# Patient Record
Sex: Female | Born: 1950 | Race: Black or African American | Hispanic: No | Smoking: Never smoker
Health system: Southern US, Community
[De-identification: ages and names within clinical notes are randomized; demographics above are authoritative.]

---

## 2007-03-09 ENCOUNTER — Emergency Department: Payer: Self-pay | Admitting: Emergency Medicine

## 2008-07-01 ENCOUNTER — Ambulatory Visit: Payer: Self-pay | Admitting: Internal Medicine

## 2008-09-02 ENCOUNTER — Ambulatory Visit: Payer: Self-pay | Admitting: Family Medicine

## 2009-12-04 ENCOUNTER — Emergency Department: Payer: Self-pay | Admitting: Emergency Medicine

## 2009-12-16 ENCOUNTER — Emergency Department: Payer: Self-pay | Admitting: Emergency Medicine

## 2017-08-30 ENCOUNTER — Other Ambulatory Visit: Payer: Self-pay

## 2017-08-30 ENCOUNTER — Inpatient Hospital Stay: Payer: Worker's Compensation

## 2017-08-30 ENCOUNTER — Inpatient Hospital Stay
Admission: AD | Admit: 2017-08-30 | Discharge: 2017-09-04 | DRG: 470 | Disposition: A | Discharge status: Home Health | Payer: Worker's Compensation | Source: Ambulatory Visit | Attending: Orthopedic Surgery | Admitting: Orthopedic Surgery

## 2017-08-30 DIAGNOSIS — M1611 Unilateral primary osteoarthritis, right hip: Secondary | ICD-10-CM | POA: Diagnosis present

## 2017-08-30 DIAGNOSIS — S72001A Fracture of unspecified part of neck of right femur, initial encounter for closed fracture: Secondary | ICD-10-CM | POA: Diagnosis present

## 2017-08-30 DIAGNOSIS — G8918 Other acute postprocedural pain: Secondary | ICD-10-CM

## 2017-08-30 DIAGNOSIS — Y9301 Activity, walking, marching and hiking: Secondary | ICD-10-CM | POA: Diagnosis present

## 2017-08-30 DIAGNOSIS — D62 Acute posthemorrhagic anemia: Secondary | ICD-10-CM | POA: Diagnosis not present

## 2017-08-30 DIAGNOSIS — W000XXA Fall on same level due to ice and snow, initial encounter: Secondary | ICD-10-CM | POA: Diagnosis present

## 2017-08-30 DIAGNOSIS — Z720 Tobacco use: Secondary | ICD-10-CM

## 2017-08-30 DIAGNOSIS — Z419 Encounter for procedure for purposes other than remedying health state, unspecified: Secondary | ICD-10-CM

## 2017-08-30 DIAGNOSIS — R5082 Postprocedural fever: Secondary | ICD-10-CM

## 2017-08-30 DIAGNOSIS — I10 Essential (primary) hypertension: Secondary | ICD-10-CM

## 2017-08-30 LAB — CBC
HCT: 41.5 % (ref 35.0–47.0)
Hemoglobin: 13.3 g/dL (ref 12.0–16.0)
MCH: 26.2 pg (ref 26.0–34.0)
MCHC: 32.1 g/dL (ref 32.0–36.0)
MCV: 81.6 fL (ref 80.0–100.0)
PLATELETS: 218 10*3/uL (ref 150–440)
RBC: 5.09 MIL/uL (ref 3.80–5.20)
RDW: 15.1 % — ABNORMAL HIGH (ref 11.5–14.5)
WBC: 5.2 10*3/uL (ref 3.6–11.0)

## 2017-08-30 LAB — SURGICAL PCR SCREEN
MRSA, PCR: NEGATIVE
STAPHYLOCOCCUS AUREUS: NEGATIVE

## 2017-08-30 LAB — URINALYSIS, ROUTINE W REFLEX MICROSCOPIC
BILIRUBIN URINE: NEGATIVE
GLUCOSE, UA: NEGATIVE mg/dL
HGB URINE DIPSTICK: NEGATIVE
KETONES UR: NEGATIVE mg/dL
Leukocytes, UA: NEGATIVE
Nitrite: NEGATIVE
PH: 5 (ref 5.0–8.0)
Protein, ur: NEGATIVE mg/dL
Specific Gravity, Urine: 1.018 (ref 1.005–1.030)

## 2017-08-30 LAB — APTT: aPTT: 29 seconds (ref 24–36)

## 2017-08-30 LAB — PROTIME-INR
INR: 0.94
PROTHROMBIN TIME: 12.5 s (ref 11.4–15.2)

## 2017-08-30 MED ORDER — SODIUM CHLORIDE 0.9 % IV SOLN
INTRAVENOUS | Status: DC
  Administered 2017-08-30 – 2017-09-01 (×3): via INTRAVENOUS

## 2017-08-30 MED ORDER — MORPHINE SULFATE (PF) 2 MG/ML IV SOLN: 2.0000 mg | INTRAVENOUS | Status: DC | PRN

## 2017-08-30 MED ORDER — OXYCODONE HCL 5 MG PO TABS: 5.0000 mg | ORAL_TABLET | ORAL | Status: DC | PRN

## 2017-08-30 NOTE — H&P (Signed)
Subjective:   Patient is a 66 y.o. female presents with right hip pain. 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 She took ibuprofen during the evening and was having persisting pain. She can't to the walk-in clinic external clinic where she is found to have a femoral neck fracture. She also has severe osteoarthritis of the hip which has not been symptomatic and she is being admitted for treatment of these conditions with right total hip replacement   ago with gradually worsening course since that time. The pain is located in the right groin. Patient describes the pain as an ache especially with any weightbearing continuous and rated as moderate. Pain has been associated with weightbearing and her fall. Patient denies prodromal symptoms. Symptoms are aggravated by weightbearing. Symptoms improve with nonweightbearing and sitting. Past history includes no prior treatment for the hip.  Previous studies include radiographs acute no clinic showing femoral neck fracture.  Patient Active Problem List   Diagnosis Date Noted  . Closed displaced fracture of right femoral neck (HCC) 08/30/2017   History reviewed. No pertinent past medical history.  History reviewed. No pertinent surgical history.  No medications prior to admission.   Allergies  Allergen Reactions  . Hydrocodone   . Sulfa Antibiotics     Social History   Tobacco Use  . Smoking status: Never Smoker  . Smokeless tobacco: Never Used  Substance Use Topics  . Alcohol use: No    Frequency: Never    History reviewed. No pertinent family history.  Review of Systems Pertinent items are noted in HPI.  Objective:   Patient Vitals for the past 8 hrs:  BP Temp Temp src Pulse Resp SpO2 Height Weight  08/30/17 1456 (!) 177/77 98.5 F (36.9 C) Oral (!) 55 18 100 % - -  08/30/17 1447 - - - - - - 5\' 5"  (1.651 m) 75.8 kg (167 lb)    No intake/output data recorded. No intake/output data recorded.    BP (!) 169/88 (BP Location: Left Arm)   Pulse 63   Temp 98.6 F (37 C) (Oral)   Resp 18   Ht 5\' 5"  (1.651 m)   Wt 75.8 kg (167 lb)   SpO2 100%   BMI 27.79 kg/m  General appearance: alert, appears stated age and mild distress Lungs: clear to auscultation bilaterally Heart: regular rate and rhythm, S1, S2 normal, no murmur, click, rub or gallop Extremities: She has 0 internal rotation on the right with pain and 10 external rotation with pain. She has palpable pulses and sensation is intact to the feet.  ECG: Pending.  Data Review labs are pending  Assessment:   Active Problems:   Closed displaced fracture of right femoral neck (HCC)   Plan:   Admitted for right hip replacement for femoral neck fracture and severe osteoarthritis

## 2017-08-30 NOTE — Progress Notes (Signed)
Pt arrived to room 148 via direct admit. Skin assessment completed with Asher MuirJamie, RN. Pt on room air. No IV at this time. No orders at this time.Pt very upset about her situation and with her job because they did not scrape the parking lot causing her to fall. Pt oriented to call bell system and unit. Call bell and phone within reach.

## 2017-08-31 ENCOUNTER — Inpatient Hospital Stay: Payer: Worker's Compensation | Admitting: Anesthesiology

## 2017-08-31 ENCOUNTER — Inpatient Hospital Stay: Payer: Worker's Compensation

## 2017-08-31 ENCOUNTER — Encounter
Admission: AD | Disposition: A | Discharge status: Home Health | Payer: Self-pay | Source: Ambulatory Visit | Attending: Orthopedic Surgery

## 2017-08-31 ENCOUNTER — Encounter: Payer: Self-pay | Admitting: Anesthesiology

## 2017-08-31 HISTORY — PX: TOTAL HIP ARTHROPLASTY: SHX124

## 2017-08-31 LAB — CBC
HEMATOCRIT: 40.8 % (ref 35.0–47.0)
Hemoglobin: 13.1 g/dL (ref 12.0–16.0)
MCH: 26.2 pg (ref 26.0–34.0)
MCHC: 32 g/dL (ref 32.0–36.0)
MCV: 82 fL (ref 80.0–100.0)
PLATELETS: 244 10*3/uL (ref 150–440)
RBC: 4.98 MIL/uL (ref 3.80–5.20)
RDW: 15 % — AB (ref 11.5–14.5)
WBC: 4.7 10*3/uL (ref 3.6–11.0)

## 2017-08-31 LAB — BASIC METABOLIC PANEL
ANION GAP: 8 (ref 5–15)
BUN: 15 mg/dL (ref 6–20)
CALCIUM: 9.2 mg/dL (ref 8.9–10.3)
CO2: 23 mmol/L (ref 22–32)
Chloride: 108 mmol/L (ref 101–111)
Creatinine, Ser: 0.85 mg/dL (ref 0.44–1.00)
GFR calc Af Amer: >60 mL/min (ref >60)
GLUCOSE: 96 mg/dL (ref 65–99)
POTASSIUM: 4 mmol/L (ref 3.5–5.1)
SODIUM: 139 mmol/L (ref 135–145)

## 2017-08-31 LAB — TYPE AND SCREEN
ABO/RH(D): O POS
Antibody Screen: NEGATIVE

## 2017-08-31 LAB — SEDIMENTATION RATE: SED RATE: 12 mm/h (ref 0–30)

## 2017-08-31 SURGERY — ARTHROPLASTY, HIP, TOTAL, ANTERIOR APPROACH
Anesthesia: Spinal | Site: Hip | Laterality: Right | Wound class: Clean

## 2017-08-31 MED ORDER — ACETAMINOPHEN 500 MG PO TABS
1000.0000 mg | ORAL_TABLET | Freq: Four times a day (QID) | ORAL | Status: AC
  Administered 2017-08-31 – 2017-09-01 (×4): 1000 mg via ORAL
  Filled 2017-08-31 (×4): qty 2

## 2017-08-31 MED ORDER — PROPOFOL 10 MG/ML IV BOLUS
INTRAVENOUS | Status: AC
  Filled 2017-08-31: qty 20

## 2017-08-31 MED ORDER — PHENOL 1.4 % MT LIQD
1.0000 | OROMUCOSAL | Status: DC | PRN
  Filled 2017-08-31: qty 177

## 2017-08-31 MED ORDER — OXYCODONE HCL 5 MG/5ML PO SOLN: 5.0000 mg | Freq: Once | ORAL | Status: DC | PRN

## 2017-08-31 MED ORDER — PROPOFOL 10 MG/ML IV BOLUS
INTRAVENOUS | Status: DC | PRN
  Administered 2017-08-31: 40 mg via INTRAVENOUS
  Administered 2017-08-31: 10 mg via INTRAVENOUS

## 2017-08-31 MED ORDER — MIDAZOLAM HCL 5 MG/5ML IJ SOLN
INTRAMUSCULAR | Status: DC | PRN
  Administered 2017-08-31: 2 mg via INTRAVENOUS

## 2017-08-31 MED ORDER — MIDAZOLAM HCL 2 MG/2ML IJ SOLN
INTRAMUSCULAR | Status: AC
  Filled 2017-08-31: qty 2

## 2017-08-31 MED ORDER — NEOMYCIN-POLYMYXIN B GU 40-200000 IR SOLN
Status: DC | PRN
  Administered 2017-08-31: 4 mL

## 2017-08-31 MED ORDER — MENTHOL 3 MG MT LOZG
1.0000 | LOZENGE | OROMUCOSAL | Status: DC | PRN
Start: 2017-08-31 — End: 2017-09-04
  Filled 2017-08-31: qty 9

## 2017-08-31 MED ORDER — LACTATED RINGERS IV SOLN
INTRAVENOUS | Status: DC | PRN
  Administered 2017-08-31: 12:00:00 via INTRAVENOUS

## 2017-08-31 MED ORDER — TRANEXAMIC ACID 1000 MG/10ML IV SOLN
INTRAVENOUS | Status: DC | PRN
  Administered 2017-08-31: 1000 mg via INTRAVENOUS

## 2017-08-31 MED ORDER — OXYCODONE HCL 5 MG PO TABS: 5.0000 mg | ORAL_TABLET | Freq: Once | ORAL | Status: DC | PRN

## 2017-08-31 MED ORDER — BUPIVACAINE-EPINEPHRINE 0.25% -1:200000 IJ SOLN
INTRAMUSCULAR | Status: DC | PRN
  Administered 2017-08-31: 30 mL

## 2017-08-31 MED ORDER — MAGNESIUM CITRATE PO SOLN
1.0000 | Freq: Once | ORAL | Status: DC | PRN
  Filled 2017-08-31: qty 296

## 2017-08-31 MED ORDER — PHENYLEPHRINE HCL 10 MG/ML IJ SOLN
INTRAMUSCULAR | Status: DC | PRN
  Administered 2017-08-31 (×2): 100 ug via INTRAVENOUS

## 2017-08-31 MED ORDER — ALUM & MAG HYDROXIDE-SIMETH 200-200-20 MG/5ML PO SUSP
30.0000 mL | ORAL | Status: DC | PRN
  Administered 2017-09-03: 30 mL via ORAL
  Filled 2017-08-31: qty 30

## 2017-08-31 MED ORDER — DEXTROSE 5 % IV SOLN
2.0000 g | Freq: Four times a day (QID) | INTRAVENOUS | Status: AC
  Administered 2017-08-31 (×2): 2 g via INTRAVENOUS
  Filled 2017-08-31 (×2): qty 2000

## 2017-08-31 MED ORDER — FENTANYL CITRATE (PF) 100 MCG/2ML IJ SOLN: 25.0000 ug | INTRAMUSCULAR | Status: DC | PRN

## 2017-08-31 MED ORDER — SUGAMMADEX SODIUM 200 MG/2ML IV SOLN
INTRAVENOUS | Status: AC
  Filled 2017-08-31: qty 2

## 2017-08-31 MED ORDER — ENOXAPARIN SODIUM 40 MG/0.4ML ~~LOC~~ SOLN
40.0000 mg | SUBCUTANEOUS | Status: DC
  Administered 2017-09-01 – 2017-09-04 (×4): 40 mg via SUBCUTANEOUS
  Filled 2017-08-31 (×4): qty 0.4

## 2017-08-31 MED ORDER — PROPOFOL 500 MG/50ML IV EMUL
INTRAVENOUS | Status: DC | PRN
  Administered 2017-08-31: 75 ug/kg/min via INTRAVENOUS

## 2017-08-31 MED ORDER — SODIUM CHLORIDE 0.9 % IV SOLN
INTRAVENOUS | Status: DC | PRN
  Administered 2017-08-31: 40 ug/min via INTRAVENOUS

## 2017-08-31 MED ORDER — ONDANSETRON HCL 4 MG/2ML IJ SOLN
4.0000 mg | Freq: Four times a day (QID) | INTRAMUSCULAR | Status: DC | PRN
  Administered 2017-08-31 – 2017-09-04 (×3): 4 mg via INTRAVENOUS
  Filled 2017-08-31 (×4): qty 2

## 2017-08-31 MED ORDER — PROPOFOL 500 MG/50ML IV EMUL
INTRAVENOUS | Status: AC
  Filled 2017-08-31: qty 50

## 2017-08-31 MED ORDER — ONDANSETRON HCL 4 MG PO TABS: 4.0000 mg | ORAL_TABLET | Freq: Four times a day (QID) | ORAL | Status: DC | PRN

## 2017-08-31 MED ORDER — CEFAZOLIN SODIUM-DEXTROSE 2-4 GM/100ML-% IV SOLN
2.0000 g | Freq: Once | INTRAVENOUS | Status: AC
  Administered 2017-08-31: 2 g via INTRAVENOUS

## 2017-08-31 MED ORDER — MAGNESIUM HYDROXIDE 400 MG/5ML PO SUSP
30.0000 mL | Freq: Every day | ORAL | Status: DC | PRN
  Administered 2017-09-01: 30 mL via ORAL
  Filled 2017-08-31: qty 30

## 2017-08-31 MED ORDER — CEFAZOLIN SODIUM-DEXTROSE 2-4 GM/100ML-% IV SOLN
INTRAVENOUS | Status: AC
  Filled 2017-08-31: qty 100

## 2017-08-31 MED ORDER — DOCUSATE SODIUM 100 MG PO CAPS
100.0000 mg | ORAL_CAPSULE | Freq: Two times a day (BID) | ORAL | Status: DC
  Administered 2017-08-31 – 2017-09-04 (×6): 100 mg via ORAL
  Filled 2017-08-31 (×7): qty 1

## 2017-08-31 MED ORDER — BISACODYL 10 MG RE SUPP: 10.0000 mg | Freq: Every day | RECTAL | Status: DC | PRN

## 2017-08-31 MED ORDER — ACETAMINOPHEN 650 MG RE SUPP: 650.0000 mg | RECTAL | Status: DC | PRN

## 2017-08-31 MED ORDER — SODIUM CHLORIDE 0.9 % IV SOLN
INTRAVENOUS | Status: DC
  Administered 2017-08-31: 15:00:00 via INTRAVENOUS

## 2017-08-31 MED ORDER — BUPIVACAINE HCL (PF) 0.5 % IJ SOLN
INTRAMUSCULAR | Status: DC | PRN
  Administered 2017-08-31: 3 mL

## 2017-08-31 MED ORDER — CEFAZOLIN SODIUM-DEXTROSE 2-4 GM/100ML-% IV SOLN: 2.0000 g | Freq: Four times a day (QID) | INTRAVENOUS | Status: DC

## 2017-08-31 MED ORDER — ACETAMINOPHEN 325 MG PO TABS
650.0000 mg | ORAL_TABLET | ORAL | Status: DC | PRN
  Administered 2017-09-01 – 2017-09-04 (×5): 650 mg via ORAL
  Filled 2017-08-31 (×5): qty 2

## 2017-08-31 MED ORDER — KETAMINE HCL 50 MG/ML IJ SOLN
INTRAMUSCULAR | Status: AC
  Filled 2017-08-31: qty 10

## 2017-08-31 MED ORDER — TRAMADOL HCL 50 MG PO TABS
100.0000 mg | ORAL_TABLET | Freq: Four times a day (QID) | ORAL | Status: DC | PRN
  Administered 2017-08-31: 100 mg via ORAL
  Administered 2017-09-01: 50 mg via ORAL
  Administered 2017-09-01: 100 mg via ORAL
  Filled 2017-08-31 (×3): qty 2

## 2017-08-31 MED ORDER — DIPHENHYDRAMINE HCL 12.5 MG/5ML PO ELIX: 12.5000 mg | ORAL_SOLUTION | ORAL | Status: DC | PRN

## 2017-08-31 SURGICAL SUPPLY — 51 items
BNDG COHESIVE 6X5 TAN STRL LF (GAUZE/BANDAGES/DRESSINGS) ×9 IMPLANT
CANISTER SUCT 1200ML W/VALVE (MISCELLANEOUS) ×3 IMPLANT
CAPT HIP TOTAL 3 ×3 IMPLANT
CHLORAPREP W/TINT 26ML (MISCELLANEOUS) ×3 IMPLANT
DRAPE C-ARM XRAY 36X54 (DRAPES) ×3 IMPLANT
DRAPE INCISE IOBAN 66X60 STRL (DRAPES) IMPLANT
DRAPE POUCH INSTRU U-SHP 10X18 (DRAPES) ×3 IMPLANT
DRAPE SHEET LG 3/4 BI-LAMINATE (DRAPES) ×9 IMPLANT
DRAPE TABLE BACK 80X90 (DRAPES) ×3 IMPLANT
DRESSING SURGICEL FIBRLLR 1X2 (HEMOSTASIS) ×2 IMPLANT
DRSG OPSITE POSTOP 4X8 (GAUZE/BANDAGES/DRESSINGS) ×6 IMPLANT
DRSG SURGICEL FIBRILLAR 1X2 (HEMOSTASIS) ×6
ELECT BLADE 6.5 EXT (BLADE) ×3 IMPLANT
ELECT REM PT RETURN 9FT ADLT (ELECTROSURGICAL) ×3
ELECTRODE REM PT RTRN 9FT ADLT (ELECTROSURGICAL) ×1 IMPLANT
EVACUATOR 1/8 PVC DRAIN (DRAIN) IMPLANT
GLOVE BIOGEL PI IND STRL 9 (GLOVE) ×1 IMPLANT
GLOVE BIOGEL PI INDICATOR 9 (GLOVE) ×2
GLOVE SURG SYN 9.0  PF PI (GLOVE) ×4
GLOVE SURG SYN 9.0 PF PI (GLOVE) ×2 IMPLANT
GOWN SRG 2XL LVL 4 RGLN SLV (GOWNS) ×1 IMPLANT
GOWN STRL NON-REIN 2XL LVL4 (GOWNS) ×2
GOWN STRL REUS W/ TWL LRG LVL3 (GOWN DISPOSABLE) ×1 IMPLANT
GOWN STRL REUS W/TWL LRG LVL3 (GOWN DISPOSABLE) ×2
HOLDER FOLEY CATH W/STRAP (MISCELLANEOUS) ×3 IMPLANT
HOOD PEEL AWAY FLYTE STAYCOOL (MISCELLANEOUS) ×3 IMPLANT
KIT PREVENA INCISION MGT 13 (CANNISTER) ×3 IMPLANT
MAT BLUE FLOOR 46X72 FLO (MISCELLANEOUS) ×3 IMPLANT
NDL SAFETY ECLIPSE 18X1.5 (NEEDLE) ×1 IMPLANT
NEEDLE HYPO 18GX1.5 SHARP (NEEDLE) ×2
NEEDLE SPNL 18GX3.5 QUINCKE PK (NEEDLE) ×3 IMPLANT
NS IRRIG 1000ML POUR BTL (IV SOLUTION) ×3 IMPLANT
PACK HIP COMPR (MISCELLANEOUS) ×3 IMPLANT
SOL PREP PVP 2OZ (MISCELLANEOUS) ×3
SOLUTION PREP PVP 2OZ (MISCELLANEOUS) ×1 IMPLANT
SPONGE DRAIN TRACH 4X4 STRL 2S (GAUZE/BANDAGES/DRESSINGS) ×3 IMPLANT
STAPLER SKIN PROX 35W (STAPLE) ×3 IMPLANT
STRAP SAFETY BODY (MISCELLANEOUS) ×3 IMPLANT
SUT DVC 2 QUILL PDO  T11 36X36 (SUTURE) ×2
SUT DVC 2 QUILL PDO T11 36X36 (SUTURE) ×1 IMPLANT
SUT SILK 0 (SUTURE) ×2
SUT SILK 0 30XBRD TIE 6 (SUTURE) ×1 IMPLANT
SUT V-LOC 90 ABS DVC 3-0 CL (SUTURE) ×3 IMPLANT
SUT VIC AB 1 CT1 36 (SUTURE) ×3 IMPLANT
SYR 20CC LL (SYRINGE) ×3 IMPLANT
SYR 30ML LL (SYRINGE) ×3 IMPLANT
SYR BULB IRRIG 60ML STRL (SYRINGE) ×3 IMPLANT
TAPE MICROFOAM 4IN (TAPE) ×3 IMPLANT
TOWEL OR 17X26 4PK STRL BLUE (TOWEL DISPOSABLE) ×3 IMPLANT
TRAY FOLEY W/METER SILVER 16FR (SET/KITS/TRAYS/PACK) ×3 IMPLANT
WND VAC CANISTER 500ML (MISCELLANEOUS) ×3 IMPLANT

## 2017-08-31 NOTE — Anesthesia Procedure Notes (Signed)
Date/Time: 08/31/2017 12:00 PM Performed by: Junious SilkNoles, Brycelyn Gambino, CRNA Pre-anesthesia Checklist: Patient identified, Emergency Drugs available, Suction available, Patient being monitored and Timeout performed Oxygen Delivery Method: Simple face mask

## 2017-08-31 NOTE — Clinical Social Work Note (Signed)
Clinical Social Work Assessment  Patient Details  Name: VICTORIAH WILDS MRN: [id number] Date of Birth: [birth year]  Date of referral:  08/31/17               Reason for consult:  Facility Placement                Permission sought to share information with:    Permission granted to share information::     Name::        Agency::     Relationship::     Contact Information:     Housing/Transportation Living arrangements for the past 2 months:  Single Family Home Source of Information:  Patient Patient Interpreter Needed:  None Criminal Activity/Legal Involvement Pertinent to Current Situation/Hospitalization:  No - Comment as needed Significant Relationships:  Adult Children Lives with:  Adult Children Do you feel safe going back to the place where you live?  Yes Need for family participation in patient care:  Yes (Comment)  Care giving concerns:  Patient lives in [place] and her son Laverna Peace stays with her.     Social Worker assessment / plan:  Holiday representative (CSW) reviewed chart and noted that patient is going for surgery for a hip fracture today. CSW met with patient alone at bedside prior to surgery today. Patient was alert and oriented X4 and was sitting up in the bed. CSW introduced self and explained role of CSW department. 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 Per patient she fell in the parking lot going into work at [employer]. Patient was very adamant that worker's comp is going to pay for hospital stay and after care. Per patient worker's comp has already been filed, claim # [id number] phone # [phone]. CSW explained that PT will work with patient after surgery and make a recommendation of home health or SNF. Patient reported that she is going home and her son and daughter will provide support. 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 CSW explained that worker's comp is sometimes difficult to get to pay  for home health or SNF. Patient reported that she is definitely going home and remains adamant that worker's comp will pay for everything. RN case Freight forwarder and RN aware of above. CSW will continue to follow and assist as needed.   Employment status:  [employer] information:  Other (Comment Required)(Worker's comp. ) PT Recommendations:  Not assessed at this time Information / Referral to community resources:  Other (Comment Required)(Patient prefers to D/C home with home health. )  Patient/Family's Response to care:  Patient prefers to go home.   Patient/Family's Understanding of and Emotional Response to Diagnosis, Current Treatment, and Prognosis:  Patient was very pleasant and thanked CSW for assistance.   Emotional Assessment Appearance:  Appears stated age Attitude/Demeanor/Rapport:    Affect (typically observed):  Pleasant Orientation:  Oriented to Self, Oriented to Place, Oriented to  Time, Oriented to Situation Alcohol / Substance use:  Not Applicable Psych involvement (Current and /or in the community):  No (Comment)  Discharge Needs  Concerns to be addressed:  Discharge Planning Concerns Readmission within the last 30 days:  No Current discharge risk:  Lives alone Barriers to Discharge:  Continued Medical Work up   [employer], Veronia Beets, LCSW 08/31/2017, 3:14 PM

## 2017-08-31 NOTE — Op Note (Signed)
08/30/2017 - 08/31/2017  1:10 PM  PATIENT:  Rose Graham  66 y.o. female  PRE-OPERATIVE DIAGNOSIS:  CLOSED FRACTURE OF RIGHT FEMUR,ACUTE RIGHT HIP PAIN with right hip degenerative osteoarthritis  POST-OPERATIVE DIAGNOSIS:  CLOSED FRACTURE OF RIGHT FEMUR,ACUTE RIGHT HIP PAIN with right hip degenerative osteoarthritis  PROCEDURE:  Procedure(s): TOTAL HIP ARTHROPLASTY ANTERIOR APPROACH (Right)  SURGEON: Leitha SchullerMichael J Shirlean Berman, MD  ASSISTANTS: None  ANESTHESIA:   spinal  EBL:  Total I/O In: -  Out: 525 [Urine:225; Blood:300]  BLOOD ADMINISTERED:none  DRAINS: none   LOCAL MEDICATIONS USED:  MARCAINE     SPECIMEN:  Source of Specimen:  Right femoral head  DISPOSITION OF SPECIMEN:  PATHOLOGY  COUNTS:  YES  TOURNIQUET:  * No tourniquets in log *  IMPLANTS: MedactaAMIS 2 standard stem with 54 mm  Mpact DM cup with liner and S 28 metal head   DICTATION: .Dragon Dictation   The patient was brought to the operating room and after spinal anesthesia was obpatient was placed on the operative table with the ipsilateral foot into the Medacta attachment, contralateral leg on a well-padded table. C-arm was brought in and preop template x-ray taken. After prepping and draping in usual sterile fashion appropriate patient identification and timeout procedures were completed. Anterior approach to the hip was obtained and centered over the greater trochanter and TFL muscle. The subcutaneous tissue was incised hemostasis being achieved by electrocautery. TFL fascia was incised and the muscle retracted laterally deep retractor placed. The lateral femoral circumflex vessels were identified and ligated. The anterior capsule was exposed and a capsulotomy performed. The neck was identified and a femoral neck cut carried out with a saw. The head was removed without difficulty and showed sclerotic femoral head and acetabulum. Reaming was carried out to52 mm and a 54 mm cup trial gave appropriate tightness to the  acetabular component a 50 4D cup was impacted into position. The leg was then externally rotated and ischiofemoral and pubofemoral releases carried out. The femur was sequentially broached to a size 2, si2 stem with standard neck and S head trials were placed and the final components chosen. The 2 standard stem was inserted along with a metal S 28 mm head an54 mm liner. The hip was reduced and was stable the wound was thoroughly irrigated wifibrillar placed on the posterior capsular incision and medial neck cutThe deep fascia v was closed using heavy Quill after infiltration of 30 cc of quarter percent Sensorcaine with epinephrine. 3-0 v-loc  to close the skin with skin staples  and wound VAC applied   PLAN OF CARE: Continue as an inpatient

## 2017-08-31 NOTE — Anesthesia Post-op Follow-up Note (Signed)
Anesthesia QCDR form completed.        

## 2017-08-31 NOTE — Anesthesia Procedure Notes (Signed)
Spinal  Patient location during procedure: OR Start time: 08/31/2017 11:38 AM End time: 08/31/2017 11:48 AM Staffing Resident/CRNA: Nelda Marseille, CRNA Performed: resident/CRNA  Preanesthetic Checklist Completed: patient identified, site marked, surgical consent, pre-op evaluation, timeout performed, IV checked, risks and benefits discussed and monitors and equipment checked Spinal Block Patient position: sitting Prep: Betadine Patient monitoring: heart rate, continuous pulse ox, blood pressure and cardiac monitor Approach: midline Location: L3-4 Injection technique: single-shot Needle Needle type: Whitacre and Introducer  Needle gauge: 25 G Needle length: 9 cm Assessment Sensory level: T10 Additional Notes Negative paresthesia. Negative blood return. Positive free-flowing CSF. Expiration date of kit checked and confirmed. Patient tolerated procedure well, without complications.

## 2017-08-31 NOTE — Plan of Care (Signed)
  Progressing Education: Knowledge of General Education information will improve 08/31/2017 1831 - Progressing by Cordie Griceodriguez, Darleen Moffitt A, RN Health Behavior/Discharge Planning: Ability to manage health-related needs will improve 08/31/2017 1831 - Progressing by Cordie Griceodriguez, Jashon Ishida A, RN Clinical Measurements: Ability to maintain clinical measurements within normal limits will improve 08/31/2017 1831 - Progressing by Cordie Griceodriguez, Choua Ikner A, RN Will remain free from infection 08/31/2017 1831 - Progressing by Cordie Griceodriguez, Anayla Giannetti A, RN Diagnostic test results will improve 08/31/2017 1831 - Progressing by Cordie Griceodriguez, Deyanna Mctier A, RN Respiratory complications will improve 08/31/2017 1831 - Progressing by Cordie Griceodriguez, Jouri Threat A, RN Cardiovascular complication will be avoided 08/31/2017 1831 - Progressing by Cordie Griceodriguez, Stasia Somero A, RN Activity: Risk for activity intolerance will decrease 08/31/2017 1831 - Progressing by Cordie Griceodriguez, Sanita Estrada A, RN Nutrition: Adequate nutrition will be maintained 08/31/2017 1831 - Progressing by Cordie Griceodriguez, Leander Tout A, RN Coping: Level of anxiety will decrease 08/31/2017 1831 - Progressing by Cordie Griceodriguez, Theora Vankirk A, RN Elimination: Will not experience complications related to bowel motility 08/31/2017 1831 - Progressing by Cordie Griceodriguez, Danica Camarena A, RN Will not experience complications related to urinary retention 08/31/2017 1831 - Progressing by Cordie Griceodriguez, Connery Shiffler A, RN Pain Managment: General experience of comfort will improve 08/31/2017 1831 - Progressing by Cordie Griceodriguez, Anothony Bursch A, RN Safety: Ability to remain free from injury will improve 08/31/2017 1831 - Progressing by Cordie Griceodriguez, Lakisha Peyser A, RN Skin Integrity: Risk for impaired skin integrity will decrease 08/31/2017 1831 - Progressing by Cordie Griceodriguez, Lulia Schriner A, RN Education: Verbalization of understanding the information provided (i.e., activity precautions, restrictions, etc) will improve 08/31/2017 1831 - Progressing by Cordie Griceodriguez, Jarad Barth A,  RN Activity: Ability to ambulate and perform ADLs will improve 08/31/2017 1831 - Progressing by Cordie Griceodriguez, Londen Lorge A, RN Clinical Measurements: Postoperative complications will be avoided or minimized 08/31/2017 1831 - Progressing by Cordie Griceodriguez, Allister Lessley A, RN Self-Concept: Ability to maintain and perform role responsibilities to the fullest extent possible will improve 08/31/2017 1831 - Progressing by Cordie Griceodriguez, Hanna Ra A, RN Pain Management: Pain level will decrease 08/31/2017 1831 - Progressing by Cordie Griceodriguez, Ayani Ospina A, RN

## 2017-08-31 NOTE — Progress Notes (Signed)
Initial Nutrition Assessment  DOCUMENTATION CODES:   Not applicable  INTERVENTION:   Premier Protein BID, each supplement provides 160 kcal and 30 grams of protein.   MVI daily   Recommend Oscal with vitamin D po BID   NUTRITION DIAGNOSIS:   Increased nutrient needs related to hip fracture as evidenced by increased estimated needs from protein.  GOAL:   Patient will meet greater than or equal to 90% of their needs  MONITOR:   PO intake, Supplement acceptance, Labs, Weight trends, I & O's, Skin  REASON FOR ASSESSMENT:   Consult Hip fracture protocol  ASSESSMENT:   66 y/o female s/p fall admitted for right hip replacement for femoral neck fracture and severe osteoarthritis   Unable to see pt today r/t pt in surgery. Per chart, pt eating 100% of meals prior to NPO and pt is weight stable for the past 2 years. RD will order supplements and MVI to encourage wound healing. Pt noted to have severe osteoarthritis; recommend Oscal with vitamin D BID. RD will obtain nutrition related history and exam at follow up.     Medications reviewed and include: NaCl @75ml /hr, morphine, oxycodone    Labs reviewed:   Unable to complete Nutrition-Focused physical exam at this time.   Diet Order:  Diet NPO time specified  EDUCATION NEEDS:   Not appropriate for education at this time  Skin:  Skin Assessment: (incision hip )  Last BM:  12/12  Height:   Ht Readings from Last 1 Encounters:  08/30/17 5\' 5"  (1.651 m)    Weight:   Wt Readings from Last 1 Encounters:  08/30/17 167 lb (75.8 kg)    Ideal Body Weight:  56.8 kg  BMI:  Body mass index is 27.79 kg/m.  Estimated Nutritional Needs:   Kcal:  1500-1800kcal/day   Protein:  76-84g/day   Fluid:  >1.8L/day   Betsey Holidayasey Jovoni Borkenhagen MS, RD, LDN Pager #[phone]- [phone] After Hours Pager: [phone]

## 2017-08-31 NOTE — Anesthesia Preprocedure Evaluation (Addendum)
Anesthesia Evaluation  Patient identified by MRN, date of birth, ID band Patient awake    Reviewed: Allergy & Precautions, H&P , NPO status , Patient's Chart, lab work & pertinent test results  History of Anesthesia Complications Negative for: history of anesthetic complications  Airway Mallampati: III  TM Distance: >3 FB Neck ROM: full    Dental  (+) Chipped, Poor Dentition, Missing, Edentulous Upper, Partial Upper   Pulmonary neg pulmonary ROS, neg shortness of breath,           Cardiovascular Exercise Tolerance: Good (-) angina(-) Past MI and (-) DOE negative cardio ROS       Neuro/Psych negative neurological ROS  negative psych ROS   GI/Hepatic negative GI ROS, Neg liver ROS, neg GERD  ,  Endo/Other  negative endocrine ROS  Renal/GU   negative genitourinary   Musculoskeletal   Abdominal   Peds negative pediatric ROS (+)  Hematology negative hematology ROS (+)   Anesthesia Other Findings History reviewed. No pertinent past medical history.  History reviewed. No pertinent surgical history.  BMI    Body Mass Index:  27.79 kg/m      Reproductive/Obstetrics negative OB ROS                            Anesthesia Physical Anesthesia Plan  ASA: I  Anesthesia Plan: Spinal   Post-op Pain Management:    Induction:   PONV Risk Score and Plan:   Airway Management Planned: Natural Airway and Nasal Cannula  Additional Equipment:   Intra-op Plan:   Post-operative Plan:   Informed Consent: I have reviewed the patients History and Physical, chart, labs and discussed the procedure including the risks, benefits and alternatives for the proposed anesthesia with the patient or authorized representative who has indicated his/her understanding and acceptance.   Dental Advisory Given  Plan Discussed with: Anesthesiologist, CRNA and Surgeon  Anesthesia Plan Comments: (Patient reports  no bleeding problems and no anticoagulant use.  Plan for spinal with backup GA  Patient consented for risks of anesthesia including but not limited to:  - adverse reactions to medications - risk of bleeding, infection, nerve damage and headache - risk of failed spinal - damage to teeth, lips or other oral mucosa - sore throat or hoarseness - Damage to heart, brain, lungs or loss of life  Patient voiced understanding.)        Anesthesia Quick Evaluation

## 2017-08-31 NOTE — Care Management (Addendum)
Case Workers name is Lendon CollarKathy Martin [phone]). Claim # [id number].LM for return call regarding setting up home health for patient.  [facility].

## 2017-08-31 NOTE — [place] FL2 (Signed)
[place] MEDICAID FL2 LEVEL OF CARE SCREENING TOOL     IDENTIFICATION  Patient Name: Rose Graham Birthdate: Mar 22, 1951 Sex: female Admission Date (Current Location): 08/30/2017  [place]   Facility and Address:  [facility], [address], [place], [place] [address]      Provider Number: [id number]  Attending Physician Name and Address:  Kennedy BuckerMenz, Michael, MD  Relative Name and Phone Number:       Current Level of Care: Hospital Recommended Level of Care: Skilled Nursing Facility Prior Approval Number:    Date Approved/Denied:   PASRR Number: ([id number] [id number])  Discharge Plan: SNF    Current Diagnoses: Patient Active Problem List   Diagnosis Date Noted  . Closed displaced fracture of right femoral neck (HCC) 08/30/2017    Orientation RESPIRATION BLADDER Height & Weight     Self, Time, Situation, Place  Normal Continent Weight: 167 lb (75.8 kg) Height:  5\' 5"  (165.1 cm)  BEHAVIORAL SYMPTOMS/MOOD NEUROLOGICAL BOWEL NUTRITION STATUS      Continent Diet(Diet: Clear Liquid to be [facility]. )  AMBULATORY STATUS COMMUNICATION OF NEEDS Skin   Extensive Assist Verbally Surgical wounds, Wound Vac(Incision: Right Hip, provena wound vac. )                       Personal Care Assistance Level of Assistance  Bathing, Feeding, Dressing Bathing Assistance: Limited assistance Feeding assistance: Independent Dressing Assistance: Limited assistance     Functional Limitations Info  Sight, Hearing, Speech Sight Info: Adequate Hearing Info: Adequate Speech Info: Adequate    SPECIAL CARE FACTORS FREQUENCY  PT (By licensed PT), OT (By licensed OT)     PT Frequency: (5) OT Frequency: (5)            Contractures      Additional Factors Info  Code Status, Allergies Code Status Info: (Full Code. ) Allergies Info: (Hydrocodone, Sulfa Antibiotics)           Current Medications (08/31/2017):  This is the current  hospital active medication list Current Facility-Administered Medications  Medication Dose Route Frequency Provider Last Rate Last Dose  . 0.9 %  sodium chloride infusion   Intravenous Continuous Kennedy BuckerMenz, Michael, MD   Stopped at 08/31/17 1522  . 0.9 %  sodium chloride infusion   Intravenous Continuous Kennedy BuckerMenz, Michael, MD 100 mL/hr at 08/31/17 1522    . acetaminophen (TYLENOL) tablet 650 mg  650 mg Oral Q4H PRN Kennedy BuckerMenz, Michael, MD       Or  . acetaminophen (TYLENOL) suppository 650 mg  650 mg Rectal Q4H PRN Kennedy BuckerMenz, Michael, MD      . acetaminophen (TYLENOL) tablet 1,000 mg  1,000 mg Oral Q6H Kennedy BuckerMenz, Michael, MD      . alum & mag hydroxide-simeth (MAALOX/MYLANTA) 200-200-20 MG/5ML suspension 30 mL  30 mL Oral Q4H PRN Kennedy BuckerMenz, Michael, MD      . bisacodyl (DULCOLAX) suppository 10 mg  10 mg Rectal Daily PRN Kennedy BuckerMenz, Michael, MD      . ceFAZolin (ANCEF) 2 g in dextrose 5 % 100 mL IVPB  2 g Intravenous Q6H Kennedy BuckerMenz, Michael, MD      . diphenhydrAMINE (BENADRYL) 12.5 MG/5ML elixir 12.5-25 mg  12.5-25 mg Oral Q4H PRN Kennedy BuckerMenz, Michael, MD      . docusate sodium (COLACE) capsule 100 mg  100 mg Oral BID Kennedy BuckerMenz, Michael, MD      . Melene Muller[START ON 09/01/2017] enoxaparin (LOVENOX) injection 40 mg  40 mg  Subcutaneous Q24H Kennedy BuckerMenz, Michael, MD      . magnesium citrate solution 1 Bottle  1 Bottle Oral Once PRN Kennedy BuckerMenz, Michael, MD      . magnesium hydroxide (MILK OF MAGNESIA) suspension 30 mL  30 mL Oral Daily PRN Kennedy BuckerMenz, Michael, MD      . menthol-cetylpyridinium (CEPACOL) lozenge 3 mg  1 lozenge Oral PRN Kennedy BuckerMenz, Michael, MD       Or  . phenol (CHLORASEPTIC) mouth spray 1 spray  1 spray Mouth/Throat PRN Kennedy BuckerMenz, Michael, MD      . morphine 2 MG/ML injection 2 mg  2 mg Intravenous Q2H PRN Kennedy BuckerMenz, Michael, MD      . ondansetron [facility](ZOFRAN) tablet 4 mg  4 mg Oral Q6H PRN Kennedy BuckerMenz, Michael, MD       Or  . ondansetron [facility](ZOFRAN) injection 4 mg  4 mg Intravenous Q6H PRN Kennedy BuckerMenz, Michael, MD   4 mg at 08/31/17 1518  . oxyCODONE (Oxy IR/ROXICODONE) immediate release  tablet 5-10 mg  5-10 mg Oral Q4H PRN Kennedy BuckerMenz, Michael, MD         Discharge Medications: Please see discharge summary for a list of discharge medications.  Relevant Imaging Results:  Relevant Lab Results:   Additional Information (SSN: [id number])  Anmol Fleck, Darleen CrockerBailey M, LCSW

## 2017-08-31 NOTE — Transfer of Care (Signed)
Immediate Anesthesia Transfer of Care Note  Patient: Rose Graham  Procedure(s) Performed: TOTAL HIP ARTHROPLASTY ANTERIOR APPROACH (Right Hip)  Patient Location: PACU  Anesthesia Type:Spinal  Level of Consciousness: sedated  Airway & Oxygen Therapy: Patient Spontanous Breathing and Patient connected to face mask oxygen  Post-op Assessment: Report given to RN and Post -op Vital signs reviewed and stable  Post vital signs: Reviewed and stable  Last Vitals:  Vitals:   08/31/17 0450 08/31/17 0838  BP: (!) 143/74 (!) 166/88  Pulse: 78 62  Resp:    Temp: 36.8 C 37 C  SpO2: 97% 98%    Last Pain:  Vitals:   08/31/17 0838  TempSrc: Oral         Complications: No apparent anesthesia complications

## 2017-09-01 ENCOUNTER — Encounter: Payer: Self-pay | Admitting: Orthopedic Surgery

## 2017-09-01 LAB — CBC
HCT: 36.7 % (ref 35.0–47.0)
Hemoglobin: 11.9 g/dL — ABNORMAL LOW (ref 12.0–16.0)
MCH: 26.5 pg (ref 26.0–34.0)
MCHC: 32.3 g/dL (ref 32.0–36.0)
MCV: 81.9 fL (ref 80.0–100.0)
PLATELETS: 216 10*3/uL (ref 150–440)
RBC: 4.48 MIL/uL (ref 3.80–5.20)
RDW: 15.2 % — AB (ref 11.5–14.5)
WBC: 7.2 10*3/uL (ref 3.6–11.0)

## 2017-09-01 LAB — BASIC METABOLIC PANEL
ANION GAP: 6 (ref 5–15)
BUN: 14 mg/dL (ref 6–20)
CALCIUM: 8.5 mg/dL — AB (ref 8.9–10.3)
CO2: 24 mmol/L (ref 22–32)
CREATININE: 0.94 mg/dL (ref 0.44–1.00)
Chloride: 106 mmol/L (ref 101–111)
GFR calc Af Amer: >60 mL/min (ref >60)
GLUCOSE: 119 mg/dL — AB (ref 65–99)
Potassium: 4 mmol/L (ref 3.5–5.1)
Sodium: 136 mmol/L (ref 135–145)

## 2017-09-01 MED ORDER — DOCUSATE SODIUM 100 MG PO CAPS: 100.0000 mg | ORAL_CAPSULE | Freq: Two times a day (BID) | ORAL | 0 refills | Status: AC

## 2017-09-01 MED ORDER — ENOXAPARIN SODIUM 40 MG/0.4ML ~~LOC~~ SOLN: 40.0000 mg | SUBCUTANEOUS | 0 refills | Status: AC

## 2017-09-01 MED ORDER — TRAMADOL HCL 50 MG PO TABS: 50.0000 mg | ORAL_TABLET | Freq: Four times a day (QID) | ORAL | 0 refills | Status: AC | PRN

## 2017-09-01 MED ORDER — ACETAMINOPHEN 500 MG PO TABS: 1000.0000 mg | ORAL_TABLET | Freq: Four times a day (QID) | ORAL | 0 refills | Status: AC

## 2017-09-01 NOTE — Progress Notes (Signed)
[facility]  Patient address: [address] , [place], [place] [address] [phone] PCP: Dr. Theadore NanSatter- [address]   Jason E Hinton 09/01/2017, 1:22 PM

## 2017-09-01 NOTE — Anesthesia Postprocedure Evaluation (Signed)
Anesthesia Post Note  Patient: Rose Graham  Procedure(s) Performed: TOTAL HIP ARTHROPLASTY ANTERIOR APPROACH (Right Hip)  Patient location during evaluation: Nursing Unit Anesthesia Type: Spinal Level of consciousness: awake, awake and alert, oriented and patient cooperative Pain management: pain level controlled Vital Signs Assessment: post-procedure vital signs reviewed and stable Respiratory status: spontaneous breathing and nonlabored ventilation Cardiovascular status: stable Postop Assessment: no headache Anesthetic complications: no     Last Vitals:  Vitals:   09/01/17 1345 09/01/17 1611  BP: (!) 148/68 (!) 144/65  Pulse: 66 65  Resp:    Temp: 36.9 C 36.8 C  SpO2: 98% 100%    Last Pain:  Vitals:   09/01/17 1611  TempSrc: Oral  PainSc:                  Lyn RecordsNoles,  Maya Scholer R

## 2017-09-01 NOTE — Progress Notes (Signed)
Physical Therapy Treatment Patient Details Name: Rose Graham MRN: [id number] DOB: 01-Nov-1950 Today's Date: 09/01/2017    History of Present Illness presented to ER status post fall with acute onset R hip pain; admitted with displaced R femoral neck fracture, status post R THR (08/31/17), WBAT with anterior THPs.    PT Comments    Patient very eager to participate/progress and continues to perform with min/mod assist +1 (second person available for chair follow as needed) but significantly limited by pain, nausea, weakness with transition to upright. Will plan to re-check orthostatics next date.  Patient visibly discouraged with status and inability to progress (and "walk"); provided with verbal support and encouragement.   Will continue efforts next date with close attention to progress and implications for discharge planning.   Follow Up Recommendations  Home health PT;Supervision/Assistance - 24 hour     Equipment Recommendations  Rolling walker with 5" wheels;3in1 (PT)    Recommendations for Other Services       Precautions / Restrictions Precautions Precautions: Fall;Anterior Hip Restrictions Weight Bearing Restrictions: Yes RLE Weight Bearing: Weight bearing as tolerated    Mobility  Bed Mobility Overal bed mobility: Needs Assistance Bed Mobility: Supine to Sit       Sit to supine: Mod assist;+2 for physical assistance      Transfers Overall transfer level: Needs assistance Equipment used: Rolling walker (2 wheeled) Transfers: Sit to/from Stand Sit to Stand: Min assist         General transfer comment: increased time, prefers to complete as indep as possible  Ambulation/Gait Ambulation/Gait assistance: Min assist Ambulation Distance (Feet): 3 Feet Assistive device: Rolling walker (2 wheeled)       General Gait Details: 3-point, step to gait pattern; dependant assist for advancement of R LE (despite sock in reverse to decrease friction).  Onset of  nausea/weakness with standing attempts; unable to tolerate beyond 1-2 steps as result.   Stairs            Wheelchair Mobility    Modified Rankin (Stroke Patients Only)       Balance Overall balance assessment: Needs assistance Sitting-balance support: No upper extremity supported;Feet supported Sitting balance-Leahy Scale: Good     Standing balance support: Bilateral upper extremity supported Standing balance-Leahy Scale: Fair                              Cognition Arousal/Alertness: Awake/alert Behavior During Therapy: WFL for tasks assessed/performed Overall Cognitive Status: Within Functional Limits for tasks assessed                                 General Comments: tearful, frustrated with situation and lack of progress      Exercises Other Exercises Other Exercises: Sit/stand x3 with RW, min assist-very slow and effortful; heavy weight shift to L LE with very minimal active use of L LE    General Comments        Pertinent Vitals/Pain Pain Assessment: Faces Faces Pain Scale: Hurts whole lot Pain Location: R hip Pain Descriptors / Indicators: Aching;Grimacing;Guarding Pain Intervention(s): Monitored during session;Limited activity within patient's tolerance;Premedicated before session;Repositioned    Home Living                      Prior Function            PT Goals (current goals can  now be found in the care plan section) Acute Rehab PT Goals Patient Stated Goal: to return home PT Goal Formulation: With patient Time For Goal Achievement: 09/15/17 Potential to Achieve Goals: Good Progress towards PT goals: Not progressing toward goals - comment(limited by pain)    Frequency    BID      PT Plan Current plan remains appropriate    Co-evaluation              AM-PAC PT "6 Clicks" Daily Activity  Outcome Measure  Difficulty turning over in bed (including adjusting bedclothes, sheets and  blankets)?: Unable Difficulty moving from lying on back to sitting on the side of the bed? : Unable Difficulty sitting down on and standing up from a chair with arms (e.g., wheelchair, bedside commode, etc,.)?: Unable Help needed moving to and from a bed to chair (including a wheelchair)?: A Little Help needed walking in hospital room?: A Lot Help needed climbing 3-5 steps with a railing? : A Lot 6 Click Score: 10    End of Session Equipment Utilized During Treatment: Gait belt Activity Tolerance: Patient limited by pain Patient left: in bed;with call bell/phone within reach;with bed alarm set Nurse Communication: Mobility status PT Visit Diagnosis: Muscle weakness (generalized) (M62.81);Difficulty in walking, not elsewhere classified (R26.2);Pain Pain - Right/Left: Right Pain - part of body: Hip     Time: [id number] PT Time Calculation (min) (ACUTE ONLY): 23 min  Charges:  $Gait Training: 8-22 mins $Therapeutic Activity: 8-22 mins                    G Codes:       Nastassja Witkop H. Manson PasseyBrown, PT, DPT, NCS 09/01/17, 3:35 PM [phone]

## 2017-09-01 NOTE — Progress Notes (Signed)
   Subjective: 1 Day Post-Op Procedure(s) (LRB): TOTAL HIP ARTHROPLASTY ANTERIOR APPROACH (Right) Patient reports pain as 0 on 0-10 scale.  Nauseated with Oxycodone. Oxycodone dc. Tolerating ultram and tylenol well. Patient is well, and has had no acute complaints or problems Denies any CP, SOB, ABD pain. We will start therapy today.    Objective: Vital signs in last 24 hours: Temp:  [97 F (36.1 C)-99.6 F (37.6 C)] 99.6 F (37.6 C) (12/14 0500) Pulse Rate:  [45-81] 72 (12/14 0500) Resp:  [9-23] 18 (12/14 0500) BP: (88-166)/(51-88) 140/57 (12/14 0500) SpO2:  [94 %-100 %] 96 % (12/14 0500)  Intake/Output from previous day: 12/13 0701 - 12/14 0700 In: 1370 [P.O.:50; I.V.:1320] Out: 1175 [Urine:875; Blood:300] Intake/Output this shift: No intake/output data recorded.  Recent Labs    08/30/17 1936 08/31/17 1042 09/01/17 0335  HGB 13.3 13.1 11.9*   Recent Labs    08/31/17 1042 09/01/17 0335  WBC 4.7 7.2  RBC 4.98 4.48  HCT 40.8 36.7  PLT 244 216   Recent Labs    08/31/17 1100 09/01/17 0335  NA 139 136  K 4.0 4.0  CL 108 106  CO2 23 24  BUN 15 14  CREATININE 0.85 0.94  GLUCOSE 96 119*  CALCIUM 9.2 8.5*   Recent Labs    08/30/17 1936  INR 0.94    EXAM General - Patient is Alert, Appropriate and Oriented Extremity - Neurovascular intact Sensation intact distally Intact pulses distally Dorsiflexion/Plantar flexion intact No cellulitis present Compartment soft Dressing - dressing C/D/I and no drainage, wound vac intact with no drainage Motor Function - intact, moving foot and toes well on exam.   History reviewed. No pertinent past medical history.  Assessment/Plan:   1 Day Post-Op Procedure(s) (LRB): TOTAL HIP ARTHROPLASTY ANTERIOR APPROACH (Right) Active Problems:   Closed displaced fracture of right femoral neck (HCC)  Estimated body mass index is 27.79 kg/m as calculated from the following:   Height as of this encounter: 5\' 5"  (1.651  m).   Weight as of this encounter: 75.8 kg (167 lb). Advance diet Up with therapy  Needs BM Acute post op blood loss anemia - Hgb 11.9, stable. Patient asymptomatic. VSS Encouraged incentive spirometer D/C wound vac on 09/07/17, apply honeycomb dressing Follow up with [facility] in 2 weeks for staple removal   DVT Prophylaxis - Lovenox, Foot Pumps and TED hose Weight-Bearing as tolerated to right leg   T. Cranston Neighborhris Melquisedec Journey, PA-C [facility] 09/01/2017, 7:58 AM

## 2017-09-01 NOTE — Discharge Instructions (Signed)

## 2017-09-01 NOTE — Progress Notes (Signed)
Clinical Social Worker (CSW) received SNF consult. PT is recommending home health. RN case manager aware of above. Please reconsult if future social work needs arise. CSW signing off.   Shuntel Fishburn, LCSW [phone] 

## 2017-09-01 NOTE — Care Management (Signed)
Left message with Lendon CollarKathy Martin, workman's comp case manager for return call for home health.

## 2017-09-01 NOTE — Care Management (Signed)
Barbara CowerJason with [facility] states that patient  has an Barrister's clerkadjuster by the name of Talbert Cagellison Moore.  She will be out of the office until Monday. Barbara CowerJason with [facility] states they are working to get approval and will contact patient as soon as authorization is received.  Spoke with Dr. Rosita KeaMenz. Made him aware that patient will not get home health PT until [facility] gets approval from the [employer]. He verbalized understanding. Made patient aware of the same thing. She does not want to file the claim under her regular insurance. She needs walker. Ordered from [facility].

## 2017-09-01 NOTE — Discharge Summary (Signed)
Physician Discharge Summary  Patient ID: Rose Graham MRN: [id number] DOB/AGE: [birth year] 66 y.o.  Admit date: 08/30/2017 Discharge date: 09/04/2017 Admission Diagnoses:  rt hip pain fx femoral neck     Discharge Diagnoses: Patient Active Problem List   Diagnosis Date Noted  . Closed displaced fracture of right femoral neck (HCC) 08/30/2017    History reviewed. No pertinent past medical history.   Transfusion: none   Consultants (if any):   Discharged Condition: Improved  Hospital Course: Rose Graham is an 66 y.o. female who was admitted 08/30/2017 with a diagnosis of right hip osteoarthritis and proximal femur fracture and went to the operating room on 08/31/2017 and underwent the above named procedures.    Surgeries: Procedure(s): TOTAL HIP ARTHROPLASTY ANTERIOR APPROACH on 08/31/2017 Patient tolerated the surgery well. Taken to PACU where she was stabilized and then transferred to the orthopedic floor.  Started on Lovenox 40 mg q 24 hrs. Foot pumps applied bilaterally at 80 mm. Heels elevated on bed with rolled towels. No evidence of DVT. Negative Homan. Physical therapy started on day #1 for gait training and transfer. OT started day #1 for ADL and assisted devices.  Patient's foley was d/c on day #1. Patient's IV was d/c on day #2. Initially slow progress with PT, by post op day 4 patient was making good progress with PT.   On post op day #4 patient was stable and ready for discharge to home with HHPT.  Implants: MedactaAMIS 2 standard stem with 54 mm  Mpact DM cup with liner and S 28 metal head     She was given perioperative antibiotics:  Anti-infectives (From admission, onward)   Start     Dose/Rate Route Frequency Ordered Stop   08/31/17 1800  ceFAZolin (ANCEF) 2 g in dextrose 5 % 100 mL IVPB     2 g 200 mL/hr over 30 Minutes Intravenous Every 6 hours 08/31/17 1516 09/01/17 0527   08/31/17 1515  ceFAZolin (ANCEF) IVPB 2g/100 mL premix  Status:  Discontinued      2 g 200 mL/hr over 30 Minutes Intravenous Every 6 hours 08/31/17 1512 08/31/17 1515   08/31/17 1045  ceFAZolin (ANCEF) IVPB 2g/100 mL premix     2 g 200 mL/hr over 30 Minutes Intravenous  Once 08/31/17 1042 08/31/17 1201   08/31/17 1041  ceFAZolin (ANCEF) 2-4 GM/100ML-% IVPB    Comments:  Machia, Millissa   : cabinet override      08/31/17 1041 08/31/17 1151    .  She was given sequential compression devices, early ambulation, and Lovneox for DVT prophylaxis.  She benefited maximally from the hospital stay and there were no complications.    Recent vital signs:  Vitals:   09/03/17 1455 09/03/17 1957  BP: (!) 149/64 (!) 155/47  Pulse: 89 89  Resp: 18   Temp: 99.1 F (37.3 C) 99.1 F (37.3 C)  SpO2: 98% 93%    Recent laboratory studies:  Lab Results  Component Value Date   HGB 12.0 09/03/2017   HGB 11.9 (L) 09/01/2017   HGB 13.1 08/31/2017   Lab Results  Component Value Date   WBC 9.9 09/03/2017   PLT 242 09/03/2017   Lab Results  Component Value Date   INR 0.94 08/30/2017   Lab Results  Component Value Date   NA 136 09/01/2017   K 4.0 09/01/2017   CL 106 09/01/2017   CO2 24 09/01/2017   BUN 14 09/01/2017   CREATININE 0.94 09/01/2017  GLUCOSE 119 (H) 09/01/2017    Discharge Medications:   Allergies as of 09/04/2017      Reactions   Hydrocodone    Sulfa Antibiotics       Medication List    TAKE these medications   acetaminophen 500 MG tablet Commonly known as:  TYLENOL Take 2 tablets (1,000 mg total) by mouth every 6 (six) hours.   aspirin 81 MG chewable tablet Chew 81 mg by mouth daily.   docusate sodium 100 MG capsule Commonly known as:  COLACE Take 1 capsule (100 mg total) by mouth 2 (two) times daily.   enoxaparin 40 MG/0.4ML injection Commonly known as:  LOVENOX Inject 0.4 mLs (40 mg total) into the skin daily.   omeprazole 20 MG capsule Commonly known as:  PRILOSEC Take 1 capsule (20 mg total) by mouth daily.   traMADol 50 MG  tablet Commonly known as:  ULTRAM Take 1-2 tablets (50-100 mg total) by mouth every 6 (six) hours as needed for moderate pain.            Durable Medical Equipment  (From admission, onward)        Start     Ordered   08/31/17 1513  DME Walker rolling  Once    Question:  Patient needs a walker to treat with the following condition  Answer:  Status post total hip replacement, right   08/31/17 1512   08/31/17 1513  DME 3 n 1  Once     08/31/17 1512   08/31/17 1513  DME Bedside commode  Once    Question:  Patient needs a bedside commode to treat with the following condition  Answer:  Status post total hip replacement, right   08/31/17 1512      Diagnostic Studies: Dg Chest 2 View  Result Date: 09/03/2017 CLINICAL DATA:  66 year old female status post right hip surgery postoperative day 3. Fever. EXAM: CHEST  2 VIEW COMPARISON:  08/30/2017 and earlier. FINDINGS: Lung volumes are within normal limits. There is mild patchy opacity at the left lung base. No pleural effusion. No other abnormal pulmonary opacity. Normal cardiac size and mediastinal contours. Visualized tracheal air column is within normal limits. Negative visible bowel gas pattern. Mild spinal scoliosis. Degenerative changes at the right shoulder. No acute osseous abnormality identified. IMPRESSION: Mild patchy left lung base opacity most resembles atelectasis. No pleural effusion. Electronically Signed   By: Odessa FlemingH  Hall M.D.   On: 09/03/2017 14:14   Chest Portable 1 View  Result Date: 08/30/2017 CLINICAL DATA:  Hypertension and congestion. EXAM: PORTABLE CHEST 1 VIEW COMPARISON:  12/16/2009 FINDINGS: Portable AP upright view of the chest. Borderline cardiomegaly. Mild aortic atherosclerosis with slight uncoiling of the thoracic aorta. No aneurysm. Lungs are clear. Degenerative changes are noted along the dorsal spine included left shoulder. IMPRESSION: 1. No active disease. 2. Aortic atherosclerosis. Electronically Signed   By:  Tollie Ethavid  Kwon M.D.   On: 08/30/2017 19:06   Dg Hip Operative Unilat W Or W/o Pelvis Right  Result Date: 08/31/2017 CLINICAL DATA:  Right hip replacement EXAM: OPERATIVE RIGHT HIP WITH PELVIS COMPARISON:  None. FLUOROSCOPY TIME:  Radiation Exposure Index (as provided by the fluoroscopic device): 4.4 mGy If the device does not provide the exposure index: Fluoroscopy Time:  12 seconds Number of Acquired Images:  3 FINDINGS: Initial images demonstrate degenerative change of the right hip joint. Subsequent images demonstrate a right hip prosthesis in satisfactory position. IMPRESSION: Right hip replacement Electronically Signed   By: Loraine LericheMark  Lukens M.D.   On: 08/31/2017 13:11   Dg Hip Unilat W Or W/o Pelvis 2-3 Views Right  Result Date: 08/31/2017 CLINICAL DATA:  Right hip replacement. EXAM: DG HIP (WITH OR WITHOUT PELVIS) 2-3V RIGHT COMPARISON:  None. FINDINGS: Two views show bipolar hemiarthroplasty on the right. Components grossly well positioned. No visible complication. IMPRESSION: Right hip arthroplasty. Electronically Signed   By: Paulina Fusi M.D.   On: 08/31/2017 14:08    Disposition: Final discharge disposition not confirmed    Follow-up Information    Kennedy Bucker, MD Follow up in 2 week(s).   Specialty:  Orthopedic Surgery Contact information: [address] [place] [address] [phone]            Signed: Patience Musca 09/04/2017, 8:21 AM

## 2017-09-01 NOTE — Evaluation (Signed)
Physical Therapy Evaluation Patient Details Name: Rose Graham MRN: [id number] DOB: Jan 23, 1951 Today's Date: 09/01/2017   History of Present Illness  presented to ER status post fall with acute onset R hip pain; admitted with displaced R femoral neck fracture, status post R THR (08/31/17), WBAT with anterior THPs.  Clinical Impression  Upon evaluation, patient alert and oriented; follows all commands and demonstrates good effort with all functional activities.  Very eager for OOB, anxious to recover functional ability.  R LE strength and ROM generally guarded due to pain, demonstrating strength at least 3-/5 and R hip ROM approx 50% normal range.  Currently requiring min assist for bed mobility; min assist for sit/stand, basic transfers and gait (5') with RW.  Decreased active use/weight shift to R LE; heavy WBing bilat UEs with all standing activities.  Mod difficulty with R LE advancement (due to pain); requiring manual assist for advancement/placement.  Will continue to progress gait distance, activity tolerance as appropriate. Would benefit from skilled PT to address above deficits and promote optimal return to PLOF; Recommend transition to HHPT upon discharge from acute hospitalization.     Follow Up Recommendations Home health PT    Equipment Recommendations  Rolling walker with 5" wheels;3in1 (PT)    Recommendations for Other Services       Precautions / Restrictions Precautions Precautions: Fall Restrictions Weight Bearing Restrictions: No      Mobility  Bed Mobility Overal bed mobility: Needs Assistance Bed Mobility: Supine to Sit     Supine to sit: Min assist     General bed mobility comments: assist for R LE management, increased time required  Transfers Overall transfer level: Needs assistance Equipment used: Rolling walker (2 wheeled) Transfers: Sit to/from Stand Sit to Stand: Min assist         General transfer comment: minimal use of R LE with movement  transition; heavy use of UEs to lift off and stabilize  Ambulation/Gait Ambulation/Gait assistance: Min assist Ambulation Distance (Feet): 5 Feet Assistive device: Rolling walker (2 wheeled)       General Gait Details: step to gait pattern; decreased stance time/weight acceptance R LE; mild difficulty advancing (due to pain) requiring manual assist for advancement  Stairs            Wheelchair Mobility    Modified Rankin (Stroke Patients Only)       Balance Overall balance assessment: Needs assistance Sitting-balance support: No upper extremity supported;Feet supported Sitting balance-Leahy Scale: Good     Standing balance support: Bilateral upper extremity supported Standing balance-Leahy Scale: Fair                               Pertinent Vitals/Pain Pain Assessment: Faces Faces Pain Scale: Hurts even more Pain Location: R hip Pain Descriptors / Indicators: Aching;Grimacing;Guarding Pain Intervention(s): Limited activity within patient's tolerance;Monitored during session;Repositioned;Premedicated before session    Home Living Family/patient expects to be discharged to:: Private residence Living Arrangements: Children Available Help at Discharge: Family Type of Home: House Home Access: Stairs to enter Entrance Stairs-Rails: Doctor, general practiceight;Left Entrance Stairs-Number of Steps: 3 Home Layout: One level Home Equipment: None      Prior Function Level of Independence: Independent         Comments: Indep with ADLs, household and community mobilization; working full-time as LawyerCNA at [employer]     Hand Dominance        Extremity/Trunk Assessment   Upper Extremity Assessment  Upper Extremity Assessment: Overall WFL for tasks assessed    Lower Extremity Assessment Lower Extremity Assessment: Generalized weakness(R hip grossly 3-/5, limited by pain; otherwise, strength, ROM and sensation intact and WFL)       Communication   Communication:  No difficulties  Cognition Arousal/Alertness: Awake/alert Behavior During Therapy: WFL for tasks assessed/performed Overall Cognitive Status: Within Functional Limits for tasks assessed                                        General Comments      Exercises Other Exercises Other Exercises: Supine LE therex, 1x10, AROM for muscular strength/endurance: ankle pumps, quad sets, SAQs, hip abduct/adduct and heel slides.   Assessment/Plan    PT Assessment Patient needs continued PT services  PT Problem List Decreased strength;Decreased range of motion;Decreased activity tolerance;Decreased balance;Decreased mobility;Decreased knowledge of use of DME;Decreased safety awareness;Decreased knowledge of precautions;Decreased skin integrity;Pain       PT Treatment Interventions DME instruction;Gait training;Stair training;Functional mobility training;Therapeutic activities;Therapeutic exercise;Balance training;Patient/family education    PT Goals (Current goals can be found in the Care Plan section)  Acute Rehab PT Goals Patient Stated Goal: to return home PT Goal Formulation: With patient Time For Goal Achievement: 09/15/17 Potential to Achieve Goals: Good    Frequency BID   Barriers to discharge        Co-evaluation               AM-PAC PT "6 Clicks" Daily Activity  Outcome Measure Difficulty turning over in bed (including adjusting bedclothes, sheets and blankets)?: Unable Difficulty moving from lying on back to sitting on the side of the bed? : Unable Difficulty sitting down on and standing up from a chair with arms (e.g., wheelchair, bedside commode, etc,.)?: Unable Help needed moving to and from a bed to chair (including a wheelchair)?: A Little Help needed walking in hospital room?: A Little Help needed climbing 3-5 steps with a railing? : A Lot 6 Click Score: 11    End of Session Equipment Utilized During Treatment: Gait belt Activity Tolerance:  Patient tolerated treatment well Patient left: in chair;with call bell/phone within reach Nurse Communication: Mobility status PT Visit Diagnosis: Muscle weakness (generalized) (M62.81);Difficulty in walking, not elsewhere classified (R26.2);Pain Pain - Right/Left: Right Pain - part of body: Hip    Time: [id number] PT Time Calculation (min) (ACUTE ONLY): 30 min   Charges:   PT Evaluation $PT Eval Low Complexity: 1 Low PT Treatments $Therapeutic Exercise: 8-22 mins   PT G Codes:   PT G-Codes **NOT FOR INPATIENT CLASS** Functional Assessment Tool Used: AM-PAC 6 Clicks Basic Mobility Functional Limitation: Mobility: Walking and moving around Mobility: Walking and Moving Around Current Status ([id number]): At least 40 percent but less than 60 percent impaired, limited or restricted Mobility: Walking and Moving Around Goal Status [phone]([id number]): At least 1 percent but less than 20 percent impaired, limited or restricted   Robel Wuertz H. Manson PasseyBrown, PT, DPT, NCS 09/01/17, 10:23 AM [phone]

## 2017-09-01 NOTE — Plan of Care (Signed)
  Progressing Education: Knowledge of General Education information will improve 09/01/2017 1433 - Progressing by Cordie Griceodriguez, Norene Oliveri A, RN Health Behavior/Discharge Planning: Ability to manage health-related needs will improve 09/01/2017 1433 - Progressing by Cordie Griceodriguez, Camerin Ladouceur A, RN Clinical Measurements: Ability to maintain clinical measurements within normal limits will improve 09/01/2017 1433 - Progressing by Cordie Griceodriguez, Jalesia Loudenslager A, RN Will remain free from infection 09/01/2017 1433 - Progressing by Cordie Griceodriguez, Sahej Hauswirth A, RN Diagnostic test results will improve 09/01/2017 1433 - Progressing by Cordie Griceodriguez, Adria Costley A, RN Respiratory complications will improve 09/01/2017 1433 - Progressing by Cordie Griceodriguez, Hasna Stefanik A, RN Cardiovascular complication will be avoided 09/01/2017 1433 - Progressing by Cordie Griceodriguez, Arneta Mahmood A, RN Activity: Risk for activity intolerance will decrease 09/01/2017 1433 - Progressing by Cordie Griceodriguez, Annalynne Ibanez A, RN Nutrition: Adequate nutrition will be maintained 09/01/2017 1433 - Progressing by Cordie Griceodriguez, Shawnice Tilmon A, RN Coping: Level of anxiety will decrease 09/01/2017 1433 - Progressing by Cordie Griceodriguez, Daniyla Pfahler A, RN Elimination: Will not experience complications related to bowel motility 09/01/2017 1433 - Progressing by Cordie Griceodriguez, Syd Manges A, RN Will not experience complications related to urinary retention 09/01/2017 1433 - Progressing by Cordie Griceodriguez, Jo Cerone A, RN Pain Managment: General experience of comfort will improve 09/01/2017 1433 - Progressing by Cordie Griceodriguez, Faron Whitelock A, RN Safety: Ability to remain free from injury will improve 09/01/2017 1433 - Progressing by Cordie Griceodriguez, Bengie Kaucher A, RN Skin Integrity: Risk for impaired skin integrity will decrease 09/01/2017 1433 - Progressing by Cordie Griceodriguez, Ysabela Keisler A, RN Education: Verbalization of understanding the information provided (i.e., activity precautions, restrictions, etc) will improve 09/01/2017 1433 - Progressing by Cordie Griceodriguez, Torrell Krutz A,  RN Activity: Ability to ambulate and perform ADLs will improve 09/01/2017 1433 - Progressing by Cordie Griceodriguez, Lelah Rennaker A, RN Clinical Measurements: Postoperative complications will be avoided or minimized 09/01/2017 1433 - Progressing by Cordie Griceodriguez, Kadin Canipe A, RN Self-Concept: Ability to maintain and perform role responsibilities to the fullest extent possible will improve 09/01/2017 1433 - Progressing by Cordie Griceodriguez, Lani Mendiola A, RN Pain Management: Pain level will decrease 09/01/2017 1433 - Progressing by Cordie Griceodriguez, Robbie Rideaux A, RN

## 2017-09-02 MED ORDER — ALUM & MAG HYDROXIDE-SIMETH 200-200-20 MG/5ML PO SUSP
15.0000 mL | Freq: Two times a day (BID) | ORAL | Status: DC | PRN
  Filled 2017-09-02: qty 30

## 2017-09-02 MED ORDER — ALUM & MAG HYDROXIDE-SIMETH 200-200-20 MG/5 ML NICU TOPICAL: 1.0000 "application " | Freq: Two times a day (BID) | TOPICAL | Status: DC | PRN

## 2017-09-02 MED ORDER — LACTULOSE 10 GM/15ML PO SOLN
10.0000 g | Freq: Two times a day (BID) | ORAL | Status: DC | PRN
  Administered 2017-09-02: 10 g via ORAL
  Filled 2017-09-02: qty 30

## 2017-09-02 NOTE — Progress Notes (Addendum)
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    PT Comments    Pt demonstrates improvement with therapy on this date. She is modified independent with bed mobility now however continues to require minA+1 for transfers and ambulation. She ambulates with 3-point, step to gait pattern; dependant assist for advancement of R LE (despite sock in reverse to decrease friction) but occasionally able to advance without assistance.  Pt moves very slowly and requires multiple standing rest breaks. VSS throughout ambulation. She reports nausea at end of ambulation and is closely holding emesis bag when session ended. She is able to complete all supine exercises as instructed but with very weak R hip flexion strength. At this time DC plan is updated to SNF. We will continue to work toward patient's goal of DC home but she is requiring too much assistance during this visit to make that a reasonable discharge plan. Pt will benefit from PT services to address deficits in strength, balance, and mobility in order to return to full function at home.     Follow Up Recommendations  SNF;Other (comment)(Will continue to work toward patient's goal of DC home )     Engineer, agriculturalquipment Recommendations  Rolling walker with 5" wheels;3in1 (PT)    Recommendations for Other Services       Precautions / Restrictions Precautions Precautions: Fall;Anterior Hip Restrictions Weight Bearing Restrictions: Yes RLE Weight Bearing: Weight bearing as tolerated    Mobility  Bed Mobility Overal bed mobility: Needs Assistance Bed Mobility: Supine to Sit     Supine to sit: Modified independent (Device/Increase time)     General bed mobility comments: Pt demonstrates  improved ability to perform bed mobility today but still with considerable RLE weakness during bed mobility. HOB elevated and use of bed rails  Transfers Overall transfer level: Needs assistance Equipment used: Rolling walker (2 wheeled) Transfers: Sit to/from Stand Sit to Stand: Min assist         General transfer comment: increased time, prefers to complete as indep as possible. Mild instability noted and decreased weight shift to RLE  Ambulation/Gait Ambulation/Gait assistance: Min assist Ambulation Distance (Feet): 15 Feet Assistive device: Rolling walker (2 wheeled)   Gait velocity: Decreased Gait velocity interpretation: <1.8 ft/sec, indicative of risk for recurrent falls General Gait Details: 3-point, step to gait pattern; dependant assist for advancement of R LE (despite sock in reverse to decrease friction).  Pt mvoes very slowly and requires multiple standing rest breaks. VSS throughout ambulation. She reports nausea at end of ambulation and is closely holding emesis bag when session ended.    Stairs            Wheelchair Mobility    Modified Rankin (Stroke Patients Only)       Balance Overall balance assessment: Needs assistance Sitting-balance support: No upper extremity supported;Feet supported Sitting balance-Leahy Scale: Good     Standing balance support: Bilateral upper extremity supported Standing balance-Leahy Scale: Fair                              Cognition Arousal/Alertness: Awake/alert Behavior During Therapy: WFL for tasks assessed/performed Overall Cognitive Status: Within Functional Limits for tasks assessed  Exercises Total Joint Exercises Ankle Circles/Pumps: Both;10 reps Quad Sets: Both;10 reps Gluteal Sets: Both;10 reps Towel Squeeze: Both;10 reps Short Arc Quad: Right;10 reps Heel Slides: Right;10 reps Hip ABduction/ADduction: Right;10 reps Straight Leg  Raises: Right;10 reps    General Comments        Pertinent Vitals/Pain Pain Assessment: No/denies pain Pain Location: denies R hip pain at rest, increases with ambulation but doesn't rate Pain Descriptors / Indicators: Aching;Grimacing;Guarding Pain Intervention(s): Repositioned;Monitored during session    Home Living                      Prior Function            PT Goals (current goals can now be found in the care plan section) Acute Rehab PT Goals Patient Stated Goal: to return home PT Goal Formulation: With patient Time For Goal Achievement: 09/15/17 Potential to Achieve Goals: Good Progress towards PT goals: Progressing toward goals    Frequency    BID      PT Plan Discharge plan needs to be updated    Co-evaluation              AM-PAC PT "6 Clicks" Daily Activity  Outcome Measure  Difficulty turning over in bed (including adjusting bedclothes, sheets and blankets)?: A Little Difficulty moving from lying on back to sitting on the side of the bed? : A Little Difficulty sitting down on and standing up from a chair with arms (e.g., wheelchair, bedside commode, etc,.)?: Unable Help needed moving to and from a bed to chair (including a wheelchair)?: A Little Help needed walking in hospital room?: A Lot Help needed climbing 3-5 steps with a railing? : Total 6 Click Score: 13    End of Session Equipment Utilized During Treatment: Gait belt Activity Tolerance: Patient tolerated treatment well Patient left: with call bell/phone within reach;in chair;Other (comment);with SCD's reapplied(Prefers no chair alarm, agrees not to get up without RN) Nurse Communication: Mobility status;Other (comment)(Need more ice for hip pack) PT Visit Diagnosis: Muscle weakness (generalized) (M62.81);Difficulty in walking, not elsewhere classified (R26.2);Pain Pain - Right/Left: Right Pain - part of body: Hip     Time: [id number] PT Time Calculation (min) (ACUTE  ONLY): 24 min  Charges:  $Gait Training: 8-22 mins $Therapeutic Exercise: 8-22 mins                    G Codes:       Rose Graham PT, DPT     Rose Graham 09/02/2017, 10:04 AM

## 2017-09-02 NOTE — Progress Notes (Signed)
Patient a&o, vss. BM today. Dressing dry and intact. Wound vac in place. Continue to monitor.

## 2017-09-02 NOTE — Progress Notes (Signed)
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    PT Comments    Pt is able to progress her ambulation distance this afternoon. 3-point, step to gait pattern; She is able to ambulate to door and back to bed very slowly. Requiring multiple standing rest breaks. Cues for sequencing. Less nausea this afternoon. VSS. Pt is very fatigued after ambulation and reports increase in pain. Requesting tylenol from RN. Will continue to progress ambulation and strengthening at next visit.    Follow Up Recommendations  SNF;Other (comment)(Will continue to work toward patient's goal of DC home )     Engineer, agriculturalquipment Recommendations  Rolling walker with 5" wheels;3in1 (PT)    Recommendations for Other Services       Precautions / Restrictions Precautions Precautions: Fall;Anterior Hip Restrictions Weight Bearing Restrictions: Yes RLE Weight Bearing: Weight bearing as tolerated    Mobility  Bed Mobility Overal bed mobility: Needs Assistance Bed Mobility: Supine to Sit     Supine to sit: Min assist     General bed mobility comments: Pt requires minA+1 for LE management during bed mobility this afternoon  Transfers Overall transfer level: Needs assistance Equipment used: Rolling walker (2 wheeled) Transfers: Sit to/from Stand Sit to Stand: Min assist         General transfer comment: increased time, prefers to complete as indep as possible. Mild instability noted and decreased weight shift to RLE. Cues for sequencing  Ambulation/Gait Ambulation/Gait assistance: Min assist Ambulation Distance (Feet): 30 Feet Assistive device: Rolling walker (2 wheeled)   Gait velocity: Decreased Gait velocity interpretation: <1.8 ft/sec,  indicative of risk for recurrent falls General Gait Details: 3-point, step to gait pattern; pt able to ambulate to door and back to bed very slowly. Requiring multiple standing rest breaks. Cues for sequencing. Less nausea this afternoon. VSS.   Stairs            Wheelchair Mobility    Modified Rankin (Stroke Patients Only)       Balance Overall balance assessment: Needs assistance Sitting-balance support: No upper extremity supported;Feet supported Sitting balance-Leahy Scale: Good     Standing balance support: Bilateral upper extremity supported Standing balance-Leahy Scale: Fair                              Cognition Arousal/Alertness: Awake/alert Behavior During Therapy: WFL for tasks assessed/performed Overall Cognitive Status: Within Functional Limits for tasks assessed                                        Exercises      General Comments        Pertinent Vitals/Pain Pain Assessment: No/denies pain Pain Location: denies R hip pain at rest, increases with ambulation but doesn't rate Pain Descriptors / Indicators: Aching;Grimacing;Guarding Pain Intervention(s): Monitored during session    Home Living                      Prior Function            PT Goals (current goals can now be found in the care plan  section) Acute Rehab PT Goals Patient Stated Goal: to return home PT Goal Formulation: With patient Time For Goal Achievement: 09/15/17 Potential to Achieve Goals: Good Progress towards PT goals: Progressing toward goals    Frequency    BID      PT Plan Current plan remains appropriate    Co-evaluation              AM-PAC PT "6 Clicks" Daily Activity  Outcome Measure  Difficulty turning over in bed (including adjusting bedclothes, sheets and blankets)?: A Little Difficulty moving from lying on back to sitting on the side of the bed? : A Little Difficulty sitting down on and standing up from a  chair with arms (e.g., wheelchair, bedside commode, etc,.)?: Unable Help needed moving to and from a bed to chair (including a wheelchair)?: A Little Help needed walking in hospital room?: A Lot Help needed climbing 3-5 steps with a railing? : Total 6 Click Score: 13    End of Session Equipment Utilized During Treatment: Gait belt Activity Tolerance: Patient tolerated treatment well Patient left: with call bell/phone within reach;Other (comment);with SCD's reapplied;in bed;with bed alarm set(ice pack on hip)   PT Visit Diagnosis: Muscle weakness (generalized) (M62.81);Difficulty in walking, not elsewhere classified (R26.2);Pain Pain - Right/Left: Right Pain - part of body: Hip     Time: [id number] PT Time Calculation (min) (ACUTE ONLY): 15 min  Charges:  $Gait Training: 8-22 mins                    G Codes:       Sharalyn InkJason D Naijah Lacek PT, DPT     Alexxis Mackert 09/02/2017, 2:29 PM

## 2017-09-02 NOTE — Progress Notes (Signed)
   Subjective: 2 Days Post-Op Procedure(s) (LRB): TOTAL HIP ARTHROPLASTY ANTERIOR APPROACH (Right) Patient reports pain as 4 on 0-10 scale.  States it is just very sore. Patient is well, and has had no acute complaints or problems Patient did not do well at all yesterday with physical therapy. Walk 3 and 5 feet. Did discuss with patient that we will see how she did today. If no improvement will need to consider rehabilitation. Plan is to go home But if no improvement with ambulation today we'll need to consider rehabilitation after hospital stay. no nausea and no vomiting Patient denies any chest pains or shortness of breath. Objective: Vital signs in last 24 hours: Temp:  [97.9 F (36.6 C)-100.9 F (38.3 C)] 98.8 F (37.1 C) (12/15 0720) Pulse Rate:  [65-89] 89 (12/15 0720) Resp:  [16-18] 16 (12/15 0720) BP: (144-163)/(63-72) 160/72 (12/15 0720) SpO2:  [94 %-100 %] 99 % (12/15 0720) Heels are non tender and elevated off the bed using rolled towels Intake/Output from previous day: 12/14 0701 - 12/15 0700 In: 1125 [P.O.:480; I.V.:645] Out: -  Intake/Output this shift: No intake/output data recorded.  Recent Labs    08/30/17 1936 08/31/17 1042 09/01/17 0335  HGB 13.3 13.1 11.9*   Recent Labs    08/31/17 1042 09/01/17 0335  WBC 4.7 7.2  RBC 4.98 4.48  HCT 40.8 36.7  PLT 244 216   Recent Labs    08/31/17 1100 09/01/17 0335  NA 139 136  K 4.0 4.0  CL 108 106  CO2 23 24  BUN 15 14  CREATININE 0.85 0.94  GLUCOSE 96 119*  CALCIUM 9.2 8.5*   Recent Labs    08/30/17 1936  INR 0.94    EXAM General - Patient is Alert, Appropriate and Oriented Extremity - Neurologically intact Neurovascular intact Sensation intact distally Intact pulses distally Dorsiflexion/Plantar flexion intact No cellulitis present Compartment soft  Soft but is tender no evidence of any compartment syndrome Dressing - Wound VAC in place. Motor Function - intact, moving foot and toes  well on exam.    History reviewed. No pertinent past medical history.  Assessment/Plan: 2 Days Post-Op Procedure(s) (LRB): TOTAL HIP ARTHROPLASTY ANTERIOR APPROACH (Right) Active Problems:   Closed displaced fracture of right femoral neck (HCC)  Estimated body mass index is 27.79 kg/m as calculated from the following:   Height as of this encounter: 5\' 5"  (1.651 m).   Weight as of this encounter: 75.8 kg (167 lb). Up with therapy Plan for discharge tomorrow  Labs: None DVT Prophylaxis - Lovenox, Foot Pumps and TED hose Weight-Bearing as tolerated to right leg We'll add lactulose. Patient needs a bowel movement today   Lainee Lehrman R. [facility] PA [facility] 09/02/2017, 8:00 AM

## 2017-09-02 NOTE — Progress Notes (Signed)
Pt alert and oriented. Just complaints of abdominal pain this shift. Pt feels as though she needs to move bowels. Milk of magnesia given for bowel movement this shift. Up to bedside commode and voiding with out difficulty. Surgical dressing dry and intact. Pt able to sleep in between care.

## 2017-09-02 NOTE — Progress Notes (Addendum)
Patient is A&O, VSS. Dressing is clean, dry, and intact. Pravena Wound vac in place, no drainage at this time. Pain controlled with Tylenol. Denies N/V at this time. Intermittent N/V throughout the day. Patient stated "No appetite". [facility]by [facility]. Will continue to monitor for change.  Leroy SeaKimberly Madina Galati SN, [facility]

## 2017-09-03 ENCOUNTER — Inpatient Hospital Stay: Payer: Worker's Compensation

## 2017-09-03 LAB — CBC WITH DIFFERENTIAL/PLATELET
Basophils Absolute: 0.1 10*3/uL (ref 0–0.1)
Basophils Relative: 1 %
Eosinophils Absolute: 0.1 10*3/uL (ref 0–0.7)
Eosinophils Relative: 1 %
HEMATOCRIT: 36.9 % (ref 35.0–47.0)
HEMOGLOBIN: 12 g/dL (ref 12.0–16.0)
LYMPHS ABS: 1.9 10*3/uL (ref 1.0–3.6)
Lymphocytes Relative: 19 %
MCH: 26.4 pg (ref 26.0–34.0)
MCHC: 32.5 g/dL (ref 32.0–36.0)
MCV: 81.4 fL (ref 80.0–100.0)
MONOS PCT: 13 %
Monocytes Absolute: 1.3 10*3/uL — ABNORMAL HIGH (ref 0.2–0.9)
NEUTROS ABS: 6.6 10*3/uL — AB (ref 1.4–6.5)
NEUTROS PCT: 66 %
Platelets: 242 10*3/uL (ref 150–440)
RBC: 4.53 MIL/uL (ref 3.80–5.20)
RDW: 14.9 % — ABNORMAL HIGH (ref 11.5–14.5)
WBC: 9.9 10*3/uL (ref 3.6–11.0)

## 2017-09-03 NOTE — Progress Notes (Signed)
   Subjective: 3 Days Post-Op Procedure(s) (LRB): TOTAL HIP ARTHROPLASTY ANTERIOR APPROACH (Right) Patient reports pain as moderate.   Patient is well, and has had no acute complaints or problems Patient did better with physical therapy yesterday but still not safe enough to return to home. Recommend short-term rehabilitation Plan is to go Rehab after hospital stay. no nausea and no vomiting Patient denies any chest pains or shortness of breath. Objective: Vital signs in last 24 hours: Temp:  [98.8 F (37.1 C)-100.7 F (38.2 C)] 100.2 F (37.9 C) (12/16 0439) Pulse Rate:  [77-93] 93 (12/16 0439) Resp:  [16-18] 18 (12/16 0439) BP: (141-160)/(60-72) 156/64 (12/16 0439) SpO2:  [95 %-99 %] 95 % (12/16 0439) Heels are non tender and elevated off the bed using rolled towels Intake/Output from previous day: No intake/output data recorded. Intake/Output this shift: No intake/output data recorded.  Recent Labs    08/31/17 1042 09/01/17 0335  HGB 13.1 11.9*   Recent Labs    08/31/17 1042 09/01/17 0335  WBC 4.7 7.2  RBC 4.98 4.48  HCT 40.8 36.7  PLT 244 216   Recent Labs    08/31/17 1100 09/01/17 0335  NA 139 136  K 4.0 4.0  CL 108 106  CO2 23 24  BUN 15 14  CREATININE 0.85 0.94  GLUCOSE 96 119*  CALCIUM 9.2 8.5*   No results for input(s): LABPT, INR in the last 72 hours.  EXAM General - Patient is Alert, Appropriate and Oriented Extremity - Neurologically intact Neurovascular intact Sensation intact distally Intact pulses distally Dorsiflexion/Plantar flexion intact No cellulitis present Compartment soft Dressing - Wound VAC in place Motor Function - intact, moving foot and toes well on exam.    History reviewed. No pertinent past medical history.  Assessment/Plan: 3 Days Post-Op Procedure(s) (LRB): TOTAL HIP ARTHROPLASTY ANTERIOR APPROACH (Right) Active Problems:   Closed displaced fracture of right femoral neck (HCC)  Estimated body mass index is  27.79 kg/m as calculated from the following:   Height as of this encounter: 5\' 5"  (1.651 m).   Weight as of this encounter: 75.8 kg (167 lb). Up with therapy Discharge to SNF  Labs: None DVT Prophylaxis - Lovenox, Foot Pumps and TED hose Weight-Bearing as tolerated to right leg    Jovanna Hodges R. [facility] PA [facility] 09/03/2017, 7:04 AM

## 2017-09-03 NOTE — Progress Notes (Signed)
Physical Therapy Treatment Patient Details Name: Rose Graham MRN: [id number] DOB: 1950-09-23 Today's Date: 09/03/2017    History of Present Illness presented to ER status post fall with acute onset R hip pain; admitted with displaced R femoral neck fracture, status post R THR (08/31/17), WBAT with anterior THPs.    PT Comments    Bed mobility improving today.  Patient requires head of bed elevated and bed rails but no external assist from therapist.  She is contact guard only for sit to stand transfers.  Improving weight shifting to right lower extremity during transfers.  Safe hand placement demonstrated and good stability in standing with support on walker.  She is able to improve her ambulation distance today and makes it all the way to the nurse station and back to her recliner.  Improving step length noted today and improving ability to flex right hip in order to advance right lower extremity.  Patient continues to have weakness in R hip flexion however but it improves throughout distance.  Patient reports increasing pain when shifting weight onto the right lower extremity.  Less fatigue noted during ambulation today. She is still too weak to attempt steps. Pt still requiring SNF placement at discharge. She has 4 steps to enter home and is not strong enough to perform stairs today. It is possible that she will make enough progress tomorrow to update her discharge plan. Pt is very motivated to attempt to return home instead of SNF placement. Pt will benefit from PT services to address deficits in strength, balance, and mobility in order to return to full function at home.    Follow Up Recommendations  SNF;Other (comment)(Will continue to work toward patient's goal of DC home )     Engineer, agriculturalquipment Recommendations  Rolling walker with 5" wheels;3in1 (PT)    Recommendations for Other Services       Precautions / Restrictions Precautions Precautions: Fall;Anterior Hip Restrictions Weight  Bearing Restrictions: Yes RLE Weight Bearing: Weight bearing as tolerated    Mobility  Bed Mobility Overal bed mobility: Modified Independent Bed Mobility: Supine to Sit     Supine to sit: Modified independent (Device/Increase time)     General bed mobility comments: (P) Bed mobility improving today.  Patient requires head of bed elevated and bed rails but no external assist from therapist.  Transfers Overall transfer level: Needs assistance Equipment used: Rolling walker (2 wheeled) Transfers: Sit to/from Stand Sit to Stand: (P) Min guard         General transfer comment: (P)  She is contact guard only for sit to stand transfers.  Improving weight shifting to right lower extremity during transfers.  Safe hand placement demonstrated and good stability in standing with support on walker.   Ambulation/Gait Ambulation/Gait assistance: (P) Min guard Ambulation Distance (Feet): (P) 120 Feet Assistive device: Rolling walker (2 wheeled) Gait Pattern/deviations: (P) Decreased step length - right;Decreased weight shift to right Gait velocity: Decreased Gait velocity interpretation: (P) <1.8 ft/sec, indicative of risk for recurrent falls General Gait Details: (P) She is able to improve her ambulation distance today and makes it all the way to the nurse station and back to her recliner.  Improving step length noted today and improving ability to flex right hip in order to advance right lower extremity.  Patient continues to have weakness in R hip flexion however but it improves throughout distance.  Patient reports increasing pain when shifting weight onto the right lower extremity.  Less fatigue noted during ambulation today.  She is still too weak to attempt steps., step to gait pattern; pt able to ambulate to door and back to bed very slowly. Requiring multiple standing rest breaks. Cues for sequencing. Less nausea this afternoon. VSS.   Stairs            Wheelchair Mobility     Modified Rankin (Stroke Patients Only)       Balance Overall balance assessment: Needs assistance Sitting-balance support: No upper extremity supported;Feet supported Sitting balance-Leahy Scale: Good     Standing balance support: Bilateral upper extremity supported Standing balance-Leahy Scale: Fair                              Cognition Arousal/Alertness: Awake/alert Behavior During Therapy: WFL for tasks assessed/performed Overall Cognitive Status: Within Functional Limits for tasks assessed                                        Exercises Total Joint Exercises Ankle Circles/Pumps: (P) Both;10 reps Quad Sets: (P) Both;10 reps Gluteal Sets: (P) Both;10 reps Towel Squeeze: (P) Both;10 reps Short Arc Quad: (P) Right;10 reps Heel Slides: (P) Right;10 reps Hip ABduction/ADduction: (P) Right;10 reps Straight Leg Raises: (P) Right;10 reps    General Comments        Pertinent Vitals/Pain Pain Assessment: No/denies pain Pain Location: denies R hip pain at rest, increases with WB during ambulation but doesn't rate Pain Descriptors / Indicators: Aching;Guarding Pain Intervention(s): Monitored during session    Home Living                      Prior Function            PT Goals (current goals can now be found in the care plan section) Acute Rehab PT Goals Patient Stated Goal: to return home PT Goal Formulation: With patient Time For Goal Achievement: 09/15/17 Potential to Achieve Goals: Good Progress towards PT goals: (P) Progressing toward goals    Frequency    BID      PT Plan Current plan remains appropriate    Co-evaluation              AM-PAC PT "6 Clicks" Daily Activity  Outcome Measure  Difficulty turning over in bed (including adjusting bedclothes, sheets and blankets)?: A Little Difficulty moving from lying on back to sitting on the side of the bed? : A Little Difficulty sitting down on and  standing up from a chair with arms (e.g., wheelchair, bedside commode, etc,.)?: (P) A Little Help needed moving to and from a bed to chair (including a wheelchair)?: A Little Help needed walking in hospital room?: (P) A Little Help needed climbing 3-5 steps with a railing? : Total 6 Click Score: (P) 16    End of Session Equipment Utilized During Treatment: Gait belt Activity Tolerance: Patient tolerated treatment well Patient left: (P) with call bell/phone within reach;in chair;with nursing/sitter in room(lab in room) Nurse Communication: (P) Mobility status PT Visit Diagnosis: Muscle weakness (generalized) (M62.81);Difficulty in walking, not elsewhere classified (R26.2);Pain Pain - Right/Left: Right Pain - part of body: Hip     Time: (P) 1051-(P) 1115 PT Time Calculation (min) (ACUTE ONLY): (P) 24 min  Charges:  $Gait Training: (P) 8-22 mins $Therapeutic Exercise: (P) 8-22 mins  G Codes:       Sharalyn InkJason D Ndrew Creason PT, DPT     Alisandra Son 09/03/2017, 11:34 AM

## 2017-09-04 LAB — URINALYSIS, ROUTINE W REFLEX MICROSCOPIC
BILIRUBIN URINE: NEGATIVE
Glucose, UA: NEGATIVE mg/dL
HGB URINE DIPSTICK: NEGATIVE
Ketones, ur: NEGATIVE mg/dL
Leukocytes, UA: NEGATIVE
NITRITE: NEGATIVE
PH: 6 (ref 5.0–8.0)
Protein, ur: NEGATIVE mg/dL
SPECIFIC GRAVITY, URINE: 1.012 (ref 1.005–1.030)

## 2017-09-04 LAB — SURGICAL PATHOLOGY

## 2017-09-04 MED ORDER — OMEPRAZOLE 20 MG PO CPDR: 20.0000 mg | DELAYED_RELEASE_CAPSULE | Freq: Every day | ORAL | 0 refills | Status: AC

## 2017-09-04 NOTE — Care Management (Signed)
TC to workman's comp. Adjusters name is Theodoro Kalatallison Elmore [phone]). Left message for return call. [facility]sc [facility] [place] [facility].

## 2017-09-04 NOTE — Progress Notes (Signed)
Physical Therapy Treatment Patient Details Name: Rose Graham MRN: [id number] DOB: 06-Aug-1951 Today's Date: 09/04/2017    History of Present Illness presented to ER status post fall with acute onset R hip pain; admitted with displaced R femoral neck fracture, status post R THR (08/31/17), WBAT with anterior THPs.    PT Comments    Focus of second session was more on stair training and exercises. Pt able to ambulate short distances in room with therapist to get from bed to recliner. She is able to complete stair training again during second session. She ascends/descends 4 steps. Pt is able to recall proper sequencing without cues. She moves slower during second session and has more difficulty with R hip flexion. She becomes visibly frustrated and requires frequent encouragement from therapy. Pt will be safe to enter/exit home with single rail and support from family. She is appropriate to discharge when medically stable. Pt will benefit from PT services to address deficits in strength, balance, and mobility in order to return to full function at home.    Follow Up Recommendations  Home health PT;Supervision for mobility/OOB     Equipment Recommendations  Rolling walker with 5" wheels;3in1 (PT)    Recommendations for Other Services       Precautions / Restrictions Precautions Precautions: Fall;Anterior Hip Restrictions Weight Bearing Restrictions: Yes RLE Weight Bearing: Weight bearing as tolerated    Mobility  Bed Mobility Overal bed mobility: Modified Independent Bed Mobility: Supine to Sit     Supine to sit: Modified independent (Device/Increase time)     General bed mobility comments: Continued improvement with bed mobility. No external assist required  Transfers Overall transfer level: Needs assistance Equipment used: Rolling walker (2 wheeled) Transfers: Sit to/from Stand Sit to Stand: Min guard         General transfer comment: Pt continues to improve her  speed/sequencing with transfers. Improving weight shifting to RLE. Good safety with transfers  Ambulation/Gait Ambulation/Gait assistance: Min guard Ambulation Distance (Feet): 10 Feet Assistive device: Rolling walker (2 wheeled) Gait Pattern/deviations: Decreased step length - right;Decreased weight shift to right Gait velocity: Decreased Gait velocity interpretation: <1.8 ft/sec, indicative of risk for recurrent falls General Gait Details: Focus of second session was more on stair training and exercises. Pt able to ambulate short distances in room with therapist to get from bed to recliner   Stairs Stairs: Yes   Stair Management: Two rails;Step to pattern Number of Stairs: 4 General stair comments: Pt is able to complete stair training again during second session. She ascends/descends 4 steps. Pt is able to recall proper sequencing without cues. She moves slower during second session and has more difficulty with R hip flexion. She becomes visibly frustrated and requires frequent encouragement from therapy  Wheelchair Mobility    Modified Rankin (Stroke Patients Only)       Balance Overall balance assessment: Needs assistance Sitting-balance support: No upper extremity supported;Feet supported Sitting balance-Leahy Scale: Good     Standing balance support: Bilateral upper extremity supported Standing balance-Leahy Scale: Fair                              Cognition Arousal/Alertness: Awake/alert Behavior During Therapy: WFL for tasks assessed/performed Overall Cognitive Status: Within Functional Limits for tasks assessed  Exercises Total Joint Exercises Ankle Circles/Pumps: Both;10 reps;Other (comment)(heel raises) Quad Sets: Both;10 reps Gluteal Sets: Both;10 reps Hip ABduction/ADduction: Right;10 reps Straight Leg Raises: Right;10 reps Long Arc Quad: Right;10 reps Knee Flexion: Right;10  reps Marching in Standing: Both;10 reps;Seated    General Comments        Pertinent Vitals/Pain Pain Assessment: No/denies pain Pain Location: denies R hip pain at rest, increases with WB during ambulation but doesn't rate Pain Descriptors / Indicators: Aching;Guarding Pain Intervention(s): Monitored during session    Home Living                      Prior Function            PT Goals (current goals can now be found in the care plan section) Acute Rehab PT Goals Patient Stated Goal: to return home PT Goal Formulation: With patient Time For Goal Achievement: 09/15/17 Potential to Achieve Goals: Good Progress towards PT goals: Progressing toward goals    Frequency    BID      PT Plan Current plan remains appropriate    Co-evaluation              AM-PAC PT "6 Clicks" Daily Activity  Outcome Measure  Difficulty turning over in bed (including adjusting bedclothes, sheets and blankets)?: A Little Difficulty moving from lying on back to sitting on the side of the bed? : A Little Difficulty sitting down on and standing up from a chair with arms (e.g., wheelchair, bedside commode, etc,.)?: A Little Help needed moving to and from a bed to chair (including a wheelchair)?: A Little Help needed walking in hospital room?: A Little Help needed climbing 3-5 steps with a railing? : A Lot 6 Click Score: 17    End of Session Equipment Utilized During Treatment: Gait belt Activity Tolerance: Patient tolerated treatment well Patient left: with call bell/phone within reach;in chair   PT Visit Diagnosis: Muscle weakness (generalized) (M62.81);Difficulty in walking, not elsewhere classified (R26.2);Pain Pain - Right/Left: Right Pain - part of body: Hip     Time: 1100-1125 PT Time Calculation (min) (ACUTE ONLY): 25 min  Charges:  $Gait Training: 8-22 mins $Therapeutic Exercise: 8-22 mins                    G Codes:       Rose Graham PT, DPT      Rose Graham 09/04/2017, 11:49 AM

## 2017-09-04 NOTE — Progress Notes (Signed)
   Subjective: 4 Days Post-Op Procedure(s) (LRB): TOTAL HIP ARTHROPLASTY ANTERIOR APPROACH (Right) Patient reports pain as 0 on 0-10 scale.   Patient is mild complaints of nausea improved with zofran. no abd pain. + BM. Low grade fevers, chest xray and urinalysis are negative. Patient wanting to go home with PT. PT thinks that she can make progress to go home Plan is to go Home after hospital stay. Patient denies any chest pains or shortness of breath.  Objective: Vital signs in last 24 hours: Temp:  [98.6 F (37 C)-99.1 F (37.3 C)] 99.1 F (37.3 C) (12/16 1957) Pulse Rate:  [89] 89 (12/16 1957) Resp:  [18] 18 (12/16 1455) BP: (149-155)/(47-64) 155/47 (12/16 1957) SpO2:  [93 %-98 %] 93 % (12/16 1957) Heels are non tender and elevated off the bed using rolled towels Intake/Output from previous day: 12/16 0701 - 12/17 0700 In: 480 [P.O.:480] Out: -  Intake/Output this shift: No intake/output data recorded.  Recent Labs    09/03/17 1112  HGB 12.0   Recent Labs    09/03/17 1112  WBC 9.9  RBC 4.53  HCT 36.9  PLT 242   No results for input(s): NA, K, CL, CO2, BUN, CREATININE, GLUCOSE, CALCIUM in the last 72 hours. No results for input(s): LABPT, INR in the last 72 hours.  EXAM General - Patient is Alert, Appropriate and Oriented Extremity - Neurologically intact Neurovascular intact Sensation intact distally Intact pulses distally Dorsiflexion/Plantar flexion intact No cellulitis present Compartment soft bilaterally Abdomen - soft nontender nondistended Dressing - Wound VAC in place, no drainage Motor Function - intact, moving foot and toes well on exam.    History reviewed. No pertinent past medical history.  Assessment/Plan: 4 Days Post-Op Procedure(s) (LRB): TOTAL HIP ARTHROPLASTY ANTERIOR APPROACH (Right) Active Problems:   Closed displaced fracture of right femoral neck (HCC)  Estimated body mass index is 27.79 kg/m as calculated from the following:   Height as of this encounter: 5\' 5"  (1.651 m).   Weight as of this encounter: 75.8 kg (167 lb). Up with therapy Discharge home with home health pending progress with PT Encouraged incentive spirometer Remove wound vac on 09/07/2017, apply honey comb dressing  Labs: None DVT Prophylaxis - Lovenox, Foot Pumps and TED hose Weight-Bearing as tolerated to right leg    Evon Slackhomas C. Gaines PA-C [facility] 09/04/2017, 8:12 AM

## 2017-09-04 NOTE — Care Management (Signed)
Faxed home health and DME orders to Theodoro KalataAllison Elmore and [employer]. Sheryl Long came to hospital to see patient and touch base with RNCM. [facility]

## 2017-09-04 NOTE — Care Management (Signed)
Spoke with [place] with [facility]. [facility] and [facility] has negotiated a rate for [facility] to provide PT. Will update patient.

## 2017-09-04 NOTE — Progress Notes (Signed)
Patient is being discharged home with family. IV removed with cath intact. Instructed on Lovenox injection. Reviewed meds, scripts, and last dose given. Allowed time for questions. Extra dressing given to patient by MD.

## 2017-09-04 NOTE — Progress Notes (Signed)
Physical Therapy Treatment Patient Details Name: Rose Graham MRN: [id number] DOB: 30-Dec-1950 Today's Date: 09/04/2017    History of Present Illness presented to ER status post fall with acute onset R hip pain; admitted with displaced R femoral neck fracture, status post R THR (08/31/17), WBAT with anterior THPs.    PT Comments    Pt making good progress with therapy on this date. Pt continues to improve her speed/sequencing with transfers. Improving weight shifting to RLE. Good safety with transfers. Pt demonstrates significant improvement in ambulation today as well. Improving R hip flexion while walking. Cues for increased step length and speed with ambulation. Pt continues to fatigue but not as quickly as during prior sessions. One standing rest break during ambulation. Improving weight acceptance to RLE. Pt able to ascend/descend 4 steps with bilateral rails and step-to pattern. CGA with +2 present for additional safety. No RLE buckling during steps. Pt moves slowly but safely. Attempted sideways with one railing and pt unable to perform. Encouraged pt to use one railing at home with family member supporting under her other arm. Pt feels safe about entering/exiting home at discharge. She is able to complete exercises as instructed at end of session. [facility] She [facility]. Care manager updated. Pt will benefit from PT services to address deficits in strength, balance, and mobility in order to return to full function at home.    Follow Up Recommendations  Home health PT;Supervision for mobility/OOB     Equipment Recommendations  Rolling walker with 5" wheels;3in1 (PT)    Recommendations for Other Services       Precautions / Restrictions Precautions Precautions: Fall;Anterior Hip Restrictions Weight Bearing Restrictions: Yes RLE Weight Bearing: Weight bearing as tolerated    Mobility  Bed Mobility Overal bed mobility: Modified Independent Bed Mobility: Supine to  Sit     Supine to sit: Modified independent (Device/Increase time)     General bed mobility comments: Continued improvement with bed mobility. No external assist required  Transfers Overall transfer level: Needs assistance Equipment used: Rolling walker (2 wheeled) Transfers: Sit to/from Stand Sit to Stand: Min guard         General transfer comment: Pt continues to improve her speed/sequencing with transfers. Improving weight shifting to RLE. Good safety with transfers  Ambulation/Gait Ambulation/Gait assistance: Min guard Ambulation Distance (Feet): 200 Feet Assistive device: Rolling walker (2 wheeled) Gait Pattern/deviations: Decreased step length - right;Decreased weight shift to right Gait velocity: Decreased Gait velocity interpretation: <1.8 ft/sec, indicative of risk for recurrent falls General Gait Details: Pt demonstrates significant improvement in ambulation today. Improving R hip flexion. Cues for improved step length and speed with ambulation. Pt continues to fatigue but not as quickly. One standing rest break during ambulation. Improving weight accetpance to RLE   Stairs Stairs: Yes   Stair Management: Two rails;Step to pattern Number of Stairs: 4 General stair comments: Pt able to ascend/descend 4 steps with bilateral rails and step-to pattern. CGA with +2 present for additional safety. No RLE buckling during steps. Pt moves slowly but safely. Attempted sideways with one railing and pt unable to perform. Encouraged pt to use one railing at home with family member supporting under her other arm. Pt feels safe about entering/exiting home at discharge  Wheelchair Mobility    Modified Rankin (Stroke Patients Only)       Balance Overall balance assessment: Needs assistance Sitting-balance support: No upper extremity supported;Feet supported Sitting balance-Leahy Scale: Good  Standing balance support: Bilateral upper extremity supported Standing  balance-Leahy Scale: Fair                              Cognition Arousal/Alertness: Awake/alert Behavior During Therapy: WFL for tasks assessed/performed Overall Cognitive Status: Within Functional Limits for tasks assessed                                        Exercises Total Joint Exercises Ankle Circles/Pumps: Both;10 reps Quad Sets: Both;10 reps Gluteal Sets: Both;10 reps Hip ABduction/ADduction: Right;10 reps Straight Leg Raises: Right;10 reps    General Comments        Pertinent Vitals/Pain Pain Assessment: No/denies pain Pain Location: denies R hip pain at rest, increases with WB during ambulation but doesn't rate Pain Descriptors / Indicators: Aching;Guarding Pain Intervention(s): Monitored during session    Home Living                      Prior Function            PT Goals (current goals can now be found in the care plan section) Acute Rehab PT Goals Patient Stated Goal: to return home PT Goal Formulation: With patient Time For Goal Achievement: 09/15/17 Potential to Achieve Goals: Good Progress towards PT goals: Progressing toward goals    Frequency    BID      PT Plan Discharge plan needs to be updated    Co-evaluation              AM-PAC PT "6 Clicks" Daily Activity  Outcome Measure  Difficulty turning over in bed (including adjusting bedclothes, sheets and blankets)?: A Little Difficulty moving from lying on back to sitting on the side of the bed? : A Little Difficulty sitting down on and standing up from a chair with arms (e.g., wheelchair, bedside commode, etc,.)?: A Little Help needed moving to and from a bed to chair (including a wheelchair)?: A Little Help needed walking in hospital room?: A Little Help needed climbing 3-5 steps with a railing? : A Little 6 Click Score: 18    End of Session Equipment Utilized During Treatment: Gait belt Activity Tolerance: Patient tolerated treatment  well Patient left: with call bell/phone within reach;in chair;with chair alarm set   PT Visit Diagnosis: Muscle weakness (generalized) (M62.81);Difficulty in walking, not elsewhere classified (R26.2);Pain Pain - Right/Left: Right Pain - part of body: Hip     Time: [id number] PT Time Calculation (min) (ACUTE ONLY): 31 min  Charges:  $Gait Training: 8-22 mins $Therapeutic Exercise: 8-22 mins                    G Codes:       Sharalyn InkJason D Huprich PT, DPT     Huprich,Jason 09/04/2017, 9:30 AM

## 2017-09-04 NOTE — Care Management (Signed)
TC received from Theodoro KalataAllison Elmore- Claims adjuster with [employer] workman's comp company. She would like me to [facility] and DME orders to [phone]. She states they contract  Through [facility] for home health services. She stated I could get [facility] but they would have to accept [facility] rate. Notified Jermaine with [facility] of [employer] Standard[employer]ison Elmore's phone number. He will direct me further once he knows more regarding home health.

## 2017-09-04 NOTE — Care Management (Signed)
[facility] call from [facility], case manager with [employer]. Her contact information: Office [phone] Fax [phone] Need to fax bsc, walker and home health orders to above fax number. Case Manager states she plans to come and visit patient this am.

## 2018-04-25 ENCOUNTER — Other Ambulatory Visit: Payer: Self-pay | Admitting: Student

## 2018-04-25 DIAGNOSIS — S62024D Nondisplaced fracture of middle third of navicular [scaphoid] bone of right wrist, subsequent encounter for fracture with routine healing: Secondary | ICD-10-CM

## 2018-05-09 ENCOUNTER — Encounter: Payer: Self-pay | Admitting: Radiology

## 2018-05-09 ENCOUNTER — Ambulatory Visit
Admission: RE | Admit: 2018-05-09 | Discharge: 2018-05-09 | Disposition: A | Discharge status: Home / Self Care | Payer: Medicare Other | Source: Ambulatory Visit | Attending: Student | Admitting: Student

## 2018-05-09 DIAGNOSIS — X58XXXD Exposure to other specified factors, subsequent encounter: Secondary | ICD-10-CM | POA: Insufficient documentation

## 2018-05-09 DIAGNOSIS — S62024D Nondisplaced fracture of middle third of navicular [scaphoid] bone of right wrist, subsequent encounter for fracture with routine healing: Secondary | ICD-10-CM | POA: Insufficient documentation

## 2019-05-13 ENCOUNTER — Other Ambulatory Visit: Payer: Self-pay | Admitting: Unknown Physician Specialty

## 2019-05-13 DIAGNOSIS — Z1231 Encounter for screening mammogram for malignant neoplasm of breast: Secondary | ICD-10-CM

## 2019-12-23 ENCOUNTER — Ambulatory Visit: Payer: Medicare Other | Attending: Internal Medicine

## 2019-12-23 DIAGNOSIS — Z23 Encounter for immunization: Secondary | ICD-10-CM

## 2019-12-23 NOTE — Progress Notes (Signed)
   Covid-19 Vaccination Clinic  Name:  KATLIN BORTNER    MRN: [id number] DOB: Feb 11, 1951  12/23/2019  Ms. Balandran was observed post Covid-19 immunization for 15 minutes without incident. She was provided with Vaccine Information Sheet and instruction to access the V-Safe system.   Ms. Frede was instructed to call 911 with any severe reactions post vaccine: Marland Kitchen Difficulty breathing  . Swelling of face and throat  . A fast heartbeat  . A bad rash all over body  . Dizziness and weakness   Immunizations Administered    Name Date Dose VIS Date Route   Pfizer COVID-19 Vaccine 12/23/2019  1:01 PM 0.3 mL 08/30/2019 Intramuscular   Manufacturer: [employer], [employer]   Lot: [id number]   NDC: [address]-6

## 2020-01-13 ENCOUNTER — Ambulatory Visit: Payer: Medicare Other | Attending: Internal Medicine

## 2020-01-13 DIAGNOSIS — Z23 Encounter for immunization: Secondary | ICD-10-CM

## 2020-01-13 NOTE — Progress Notes (Signed)
   Cov[facility] Vaccination [facility]  Name:  LATIFFANY HARWICK    MRN: [id number] DOB: [birth year]  01/13/2020  Ms. Peplinski was observed post Covid-19 immunization for 15 minutes without incident. She was provided with Vaccine Information Sheet and instruction to access the V-Safe system.   Ms. Alberico was instructed to call 911 with any severe reactions post vaccine: Marland Kitchen Difficulty breathing  . Swelling of face and throat  . A fast heartbeat  . A bad rash all over body  . Dizziness and weakness   Immunizations Administered    Name Date Dose VIS Date Route   Pfizer COVID-19 Vaccine 01/13/2020  1:10 PM 0.3 mL 11/13/2018 Intramuscular   Manufacturer: [employer], [employer]   Lot: [id number]   NDC: [address]-4

## 2020-04-01 IMAGING — MR MR WRIST*R* W/O CM
6 series · 40 of 40 positions shown · non-contrast
Comparison: None.

CLINICAL DATA: Closed nondisplaced fracture of the middle third of
the scaphoid of the right wrist sustained March 2018. Right hand
weakness and pain about the first MP joint.

EXAM:
MR OF THE RIGHT WRIST WITHOUT CONTRAST
TECHNIQUE: Multiplanar, multisequence MR imaging of the right wrist was
performed. No intravenous contrast was administered.

[Series 3: T2 fat-sat · axial · right · 2.0mm · 0.47mm/px · z∈[-92,-15]mm · 8 of 33 slices shown (1 of 2)]
[im 1/33]
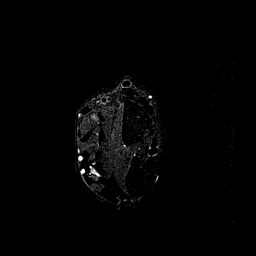
[im 5/33]
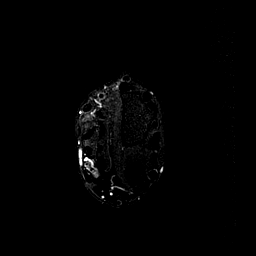
[im 10/33]
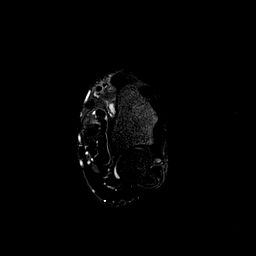
[im 14/33]
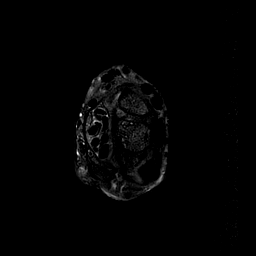
[im 19/33]
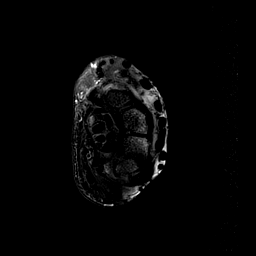
[im 23/33]
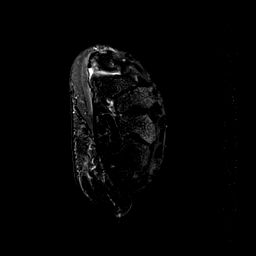
[im 28/33]
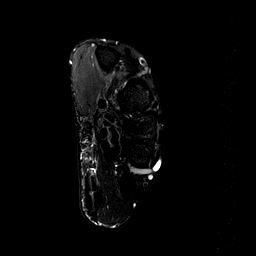
[im 33/33]
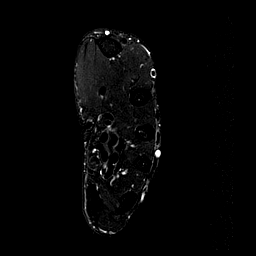

[Series 4: T1 · axial · right · 2.0mm · 0.47mm/px · z∈[-92,-15]mm · 9 of 33 slices shown (1 of 2)]
[im 1/33]
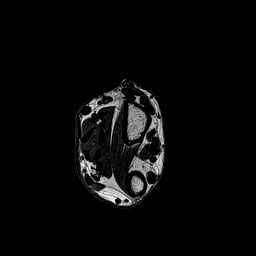
[im 5/33]
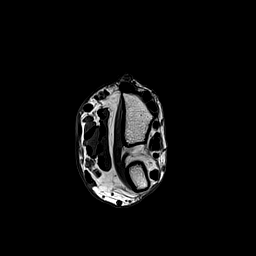
[im 9/33]
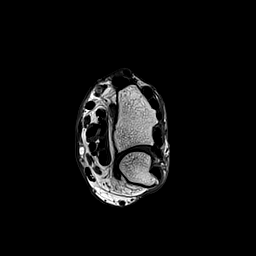
[im 13/33]
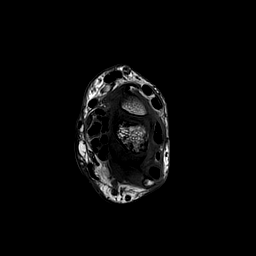
[im 17/33]
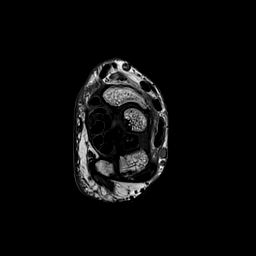
[im 21/33]
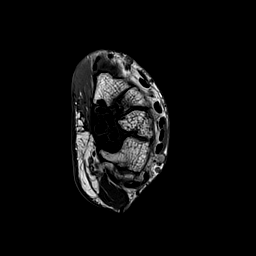
[im 25/33]
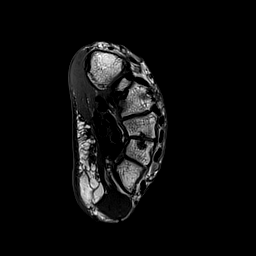
[im 29/33]
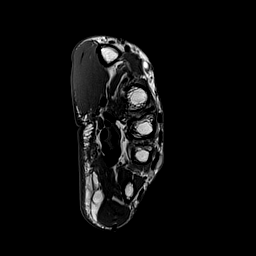
[im 33/33]
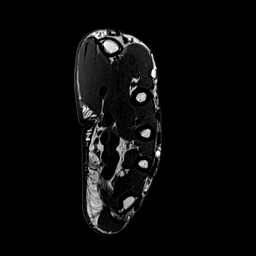

[Series 6: PD fat-sat · sagittal · right · 3.0mm · 0.39mm/px · 5 of 17 slices shown (1 of 2)]
[im 1/17]
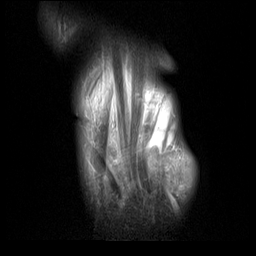
[im 5/17]
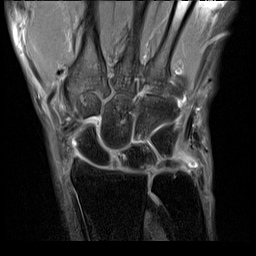
[im 9/17]
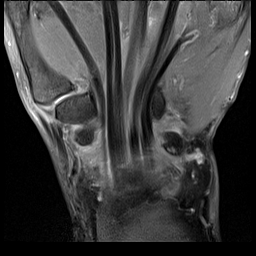
[im 13/17]
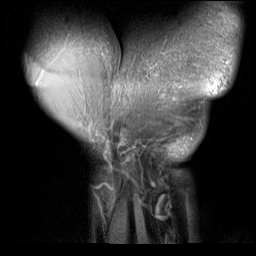
[im 17/17]
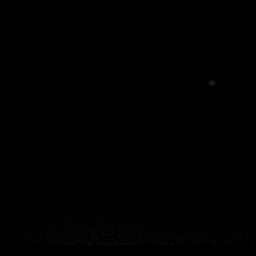

[Series 7: T2 fat-sat · sagittal · right · 3.0mm · 0.39mm/px · 5 of 17 slices shown (2 of 2)]
[im 1/17]
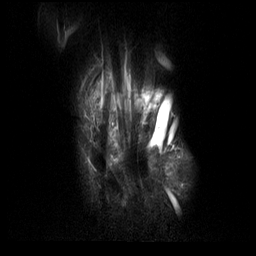
[im 5/17]
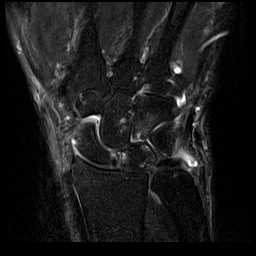
[im 9/17]
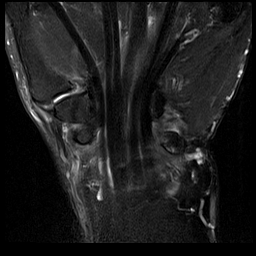
[im 13/17]
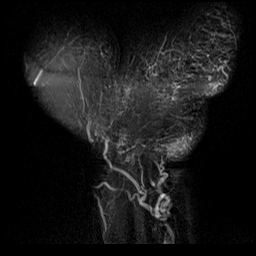
[im 17/17]
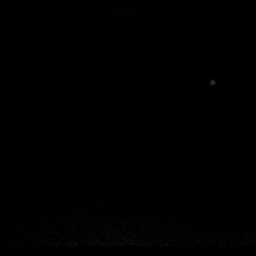

[Series 8: PD fat-sat · coronal · right · 3.0mm · 0.39mm/px · 8 of 28 slices shown (2 of 2)]
[im 1/28]
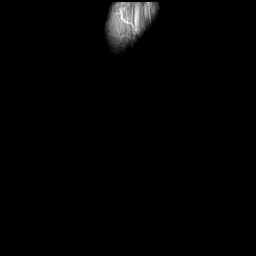
[im 4/28]
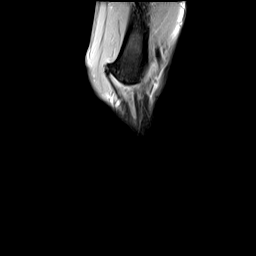
[im 8/28]
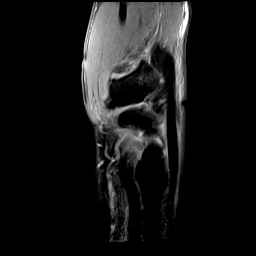
[im 12/28]
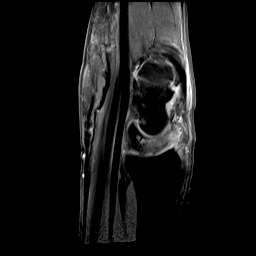
[im 16/28]
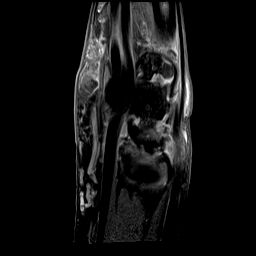
[im 20/28]
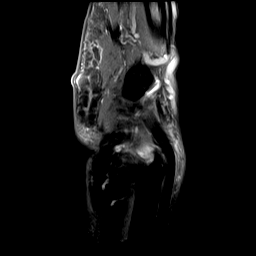
[im 24/28]
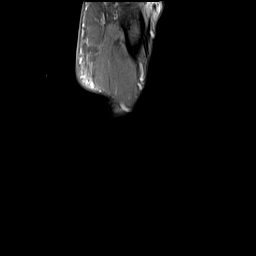
[im 28/28]
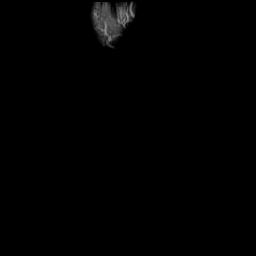

[Series 9: T1 · sagittal · right · 3.0mm · 0.39mm/px · 5 of 17 slices shown (2 of 2)]
[im 1/17]
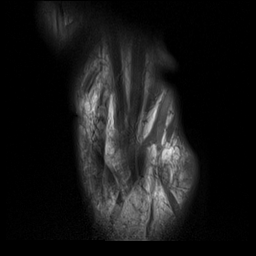
[im 5/17]
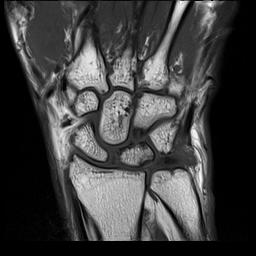
[im 9/17]
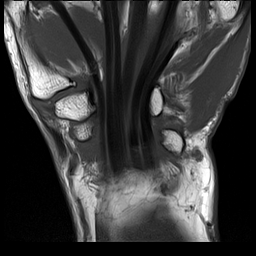
[im 13/17]
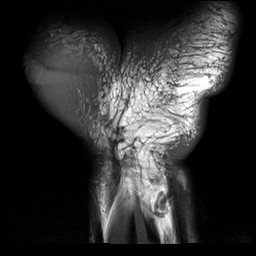
[im 17/17]
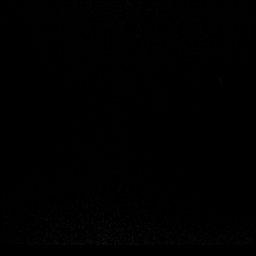

[40 of 40 positions shown; findings below may reference images not displayed]

FINDINGS: Ligaments: Widening of the scapholunate interval to 4 mm transverse
along the dorsal band, that may represent secondary signs of
scapholunate ligament injury or tear. Intact appearing
lunatotriquetral interval and ligament.

Triangular fibrocartilage: Focal linear hyperintense signal within
the thin portion of the triangular fibrocartilage, series [DATE] with
trace fluid in the distal radioulnar joint suspicious for a
full-thickness tear of the TFCC.

Tendons: Intact without evidence of tenosynovitis, subluxation or
tear.

Carpal tunnel/median nerve: Normal carpal tunnel. Normal median
nerve.

Guyon's canal: Normal.

Joint/cartilage: Mild chondral thinning within the radiocarpal
joint. No significant joint effusion.

Bones/carpal alignment: Near complete healing of reported mid
scaphoid fracture with faint residual fracture line suggested,
series [DATE]. Ulnar positive variance is identified with stigmata of
ulnar impaction on the lunate characterized by subchondral cystic
change and mild edema. Degenerative subchondral cystic change and
edema are also noted of the trapezoid, capitate, triquetrum, hamate
and fusiform. Minimal degenerative spurring of the first CMC joint

Other: 3 and 4 mm volar ganglion cysts are noted adjacent to the
radiocarpal joint.
IMPRESSION: 1. Widened scapholunate interval dorsally to 4 mm raising suspicion
for possible scapholunate ligament tear/injury.
2. Linear perforation of the thin portion of the triangular
fibrocartilage is suggested on series [DATE] with trace fluid in the
distal radioulnar joint.
3. Minimal residual from reported scaphoid fracture with near
complete healing.
4. Degenerative change of the carpal bones as above.

## 2021-03-17 ENCOUNTER — Other Ambulatory Visit: Payer: Self-pay

## 2021-03-17 ENCOUNTER — Encounter: Payer: Self-pay | Admitting: Emergency Medicine

## 2021-03-17 ENCOUNTER — Emergency Department
Admission: EM | Admit: 2021-03-17 | Discharge: 2021-03-17 | Disposition: A | Discharge status: Home / Self Care | Payer: Medicare Other | Attending: Emergency Medicine | Admitting: Emergency Medicine

## 2021-03-17 DIAGNOSIS — Z5321 Procedure and treatment not carried out due to patient leaving prior to being seen by health care provider: Secondary | ICD-10-CM | POA: Diagnosis not present

## 2021-03-17 DIAGNOSIS — R040 Epistaxis: Secondary | ICD-10-CM | POA: Diagnosis present

## 2021-03-17 MED ORDER — OXYMETAZOLINE HCL 0.05 % NA SOLN
1.0000 | Freq: Once | NASAL | Status: AC
  Administered 2021-03-17: 17:00:00 1 via NASAL
  Filled 2021-03-17: qty 30

## 2021-03-17 NOTE — ED Notes (Signed)
Pt reported to staff that if she was not being seen immediately she was leaving. RN attempted to inform pt that treatment had already been initiated with administration of nasal medication. Pt continued to refuse to stay for treatment.

## 2021-03-17 NOTE — ED Notes (Addendum)
Pt stated, "I am not waiting long. My nose been bleeding since 3:15. I need to get back and see a doctor and I am not waiting." Pt informed that she does have other patients in front of here. Pt stated, "that's ok. My niece will come and take me to [facility]. I am not waiting while I am losing all of this blood. This has been going on all day. This is an emergency." Lorrie, RN came in triage rm and spoke with pt and informed pt that she messaged the MD and is waiting on a reply. Pt not happy with response and calling niece again.

## 2021-03-17 NOTE — ED Triage Notes (Signed)
Pt arrives to ed via ems from work.  Ems reports nose bleed this morning around 6pm and then it stopped. Ems states nose started bleed again around 3pm and has not stopped. Ems reports still bleeding.   Bp-150/108  All other vitals wnl per ems

## 2021-03-17 NOTE — ED Notes (Signed)
Up out of wheelchair stating "im gone, bye". Pt then walked back in to ask for additional towel before leaving. Again RN encouraged pt to stay. Pt refused and left ED at this time

## 2023-08-21 ENCOUNTER — Other Ambulatory Visit: Payer: Self-pay | Admitting: Unknown Physician Specialty

## 2023-08-21 DIAGNOSIS — Z1231 Encounter for screening mammogram for malignant neoplasm of breast: Secondary | ICD-10-CM

## 2023-09-25 ENCOUNTER — Inpatient Hospital Stay: Admission: RE | Admit: 2023-09-25 | Payer: Medicare Other | Source: Ambulatory Visit

## 2023-10-05 ENCOUNTER — Ambulatory Visit
Admission: RE | Admit: 2023-10-05 | Discharge: 2023-10-05 | Disposition: A | Discharge status: Home / Self Care | Payer: Medicare Other | Source: Ambulatory Visit | Attending: Unknown Physician Specialty | Admitting: Unknown Physician Specialty

## 2023-10-05 DIAGNOSIS — Z1231 Encounter for screening mammogram for malignant neoplasm of breast: Secondary | ICD-10-CM | POA: Insufficient documentation

## 2023-10-06 ENCOUNTER — Inpatient Hospital Stay
Admission: RE | Admit: 2023-10-06 | Discharge: 2023-10-06 | Disposition: A | Discharge status: Home / Self Care | Payer: Self-pay | Source: Ambulatory Visit | Attending: Unknown Physician Specialty | Admitting: Unknown Physician Specialty

## 2023-10-06 ENCOUNTER — Other Ambulatory Visit: Payer: Self-pay | Admitting: *Deleted

## 2023-10-06 DIAGNOSIS — Z1231 Encounter for screening mammogram for malignant neoplasm of breast: Secondary | ICD-10-CM
